# Patient Record
Sex: Female | Born: 1990 | Race: Black or African American | Hispanic: No | Marital: Single | Smoking: Never smoker
Health system: Southern US, Community
[De-identification: ages and names within clinical notes are randomized; demographics above are authoritative.]

## PROBLEM LIST (undated history)

## (undated) ENCOUNTER — Inpatient Hospital Stay (HOSPITAL_COMMUNITY): Payer: Self-pay

## (undated) DIAGNOSIS — D649 Anemia, unspecified: Secondary | ICD-10-CM

---
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
## 2012-01-11 ENCOUNTER — Other Ambulatory Visit: Payer: Medicaid Other

## 2012-01-25 ENCOUNTER — Encounter (HOSPITAL_COMMUNITY): Payer: Self-pay

## 2012-01-25 ENCOUNTER — Ambulatory Visit (INDEPENDENT_AMBULATORY_CARE_PROVIDER_SITE_OTHER): Payer: Self-pay | Admitting: General Surgery

## 2012-01-25 ENCOUNTER — Emergency Department (INDEPENDENT_AMBULATORY_CARE_PROVIDER_SITE_OTHER)
Admission: EM | Admit: 2012-01-25 | Discharge: 2012-01-25 | Disposition: A | Discharge status: Home / Self Care | Payer: Medicaid Other | Source: Home / Self Care | Attending: Emergency Medicine | Admitting: Emergency Medicine

## 2012-01-25 DIAGNOSIS — L255 Unspecified contact dermatitis due to plants, except food: Secondary | ICD-10-CM

## 2012-01-25 DIAGNOSIS — L237 Allergic contact dermatitis due to plants, except food: Secondary | ICD-10-CM

## 2012-01-25 MED ORDER — PREDNISONE 10 MG PO TABS: ORAL_TABLET | ORAL | Status: DC

## 2012-01-25 MED ORDER — TRIAMCINOLONE ACETONIDE 0.1 % EX CREA: TOPICAL_CREAM | Freq: Three times a day (TID) | CUTANEOUS | Status: AC

## 2012-01-25 NOTE — ED Provider Notes (Signed)
Chief Complaint  Patient presents with  . Rash    History of Present Illness:   The patient is an 21 year old female who has had a five-day history of a mildly pruritic rash on her left knee. This occurred while she was outdoors at a friend's house, although she denies any definite exposure to poison ivy. She's had some mild itching and a few bumps on both of her arms and her face as well. She denies any difficulty breathing or swelling of her throat, tongue, or lips.  Review of Systems:  Other than noted above, the patient denies any of the following symptoms: Systemic:  No fever, chills, sweats, weight loss, or fatigue. ENT:  No nasal congestion, rhinorrhea, sore throat, swelling of lips, tongue or throat. Resp:  No cough, wheezing, or shortness of breath. Skin:  No rash, itching, nodules, or suspicious lesions.  PMFSH:  Past medical history, family history, social history, meds, and allergies were reviewed.  Physical Exam:   Vital signs:  BP 117/67  Pulse 54  Temp 96.8 F (36 C) (Oral)  Resp 10  SpO2 99% Gen:  Alert, oriented, in no distress. ENT:  Pharynx clear, no intraoral lesions, moist mucous membranes. Lungs:  Clear to auscultation. Skin:  She has a 2.5 x 2.5 cm patch of erythematous maculopapules and a few vesicles on her left knee. Skin was otherwise clear.  Assessment:  The encounter diagnosis was Poison ivy.  Plan:   1.  The following meds were prescribed:   New Prescriptions   PREDNISONE (DELTASONE) 10 MG TABLET    Take 4 tabs daily for 4 days, 3 tabs daily for 4 days, 2 tabs daily for 4 days, then 1 tab daily for 4 days.   TRIAMCINOLONE CREAM (KENALOG) 0.1 %    Apply topically 3 (three) times daily.   2.  The patient was instructed in symptomatic care and handouts were given. 3.  The patient was told to return if becoming worse in any way, if no better in 3 or 4 days, and given some red flag symptoms that would indicate earlier return.     Reuben Likes,  MD 01/25/12 1323

## 2012-01-25 NOTE — ED Notes (Signed)
C/o area on her lower leg itching for past couple dys

## 2012-01-26 ENCOUNTER — Encounter (INDEPENDENT_AMBULATORY_CARE_PROVIDER_SITE_OTHER): Payer: Self-pay | Admitting: General Surgery

## 2012-07-15 ENCOUNTER — Other Ambulatory Visit (HOSPITAL_COMMUNITY): Payer: Self-pay | Admitting: Obstetrics

## 2012-07-15 DIAGNOSIS — R109 Unspecified abdominal pain: Secondary | ICD-10-CM

## 2012-07-15 DIAGNOSIS — R102 Pelvic and perineal pain: Secondary | ICD-10-CM

## 2012-07-20 ENCOUNTER — Other Ambulatory Visit (HOSPITAL_COMMUNITY): Payer: Self-pay | Admitting: Obstetrics

## 2012-07-20 DIAGNOSIS — R109 Unspecified abdominal pain: Secondary | ICD-10-CM

## 2012-07-21 ENCOUNTER — Ambulatory Visit (HOSPITAL_COMMUNITY): Payer: Medicaid Other

## 2012-07-21 ENCOUNTER — Ambulatory Visit (HOSPITAL_COMMUNITY): Admission: RE | Admit: 2012-07-21 | Payer: Medicaid Other | Source: Ambulatory Visit

## 2012-07-21 ENCOUNTER — Ambulatory Visit (HOSPITAL_COMMUNITY): Payer: Medicaid Other | Attending: Obstetrics

## 2014-01-26 ENCOUNTER — Emergency Department (HOSPITAL_COMMUNITY)
Admission: EM | Admit: 2014-01-26 | Discharge: 2014-01-26 | Disposition: A | Discharge status: Home / Self Care | Payer: No Typology Code available for payment source | Attending: Emergency Medicine | Admitting: Emergency Medicine

## 2014-01-26 ENCOUNTER — Emergency Department (HOSPITAL_COMMUNITY): Payer: No Typology Code available for payment source

## 2014-01-26 ENCOUNTER — Encounter (HOSPITAL_COMMUNITY): Payer: Self-pay | Admitting: Emergency Medicine

## 2014-01-26 DIAGNOSIS — S8990XA Unspecified injury of unspecified lower leg, initial encounter: Secondary | ICD-10-CM | POA: Diagnosis present

## 2014-01-26 DIAGNOSIS — J45909 Unspecified asthma, uncomplicated: Secondary | ICD-10-CM | POA: Insufficient documentation

## 2014-01-26 DIAGNOSIS — Y9389 Activity, other specified: Secondary | ICD-10-CM | POA: Diagnosis not present

## 2014-01-26 DIAGNOSIS — Y9289 Other specified places as the place of occurrence of the external cause: Secondary | ICD-10-CM | POA: Insufficient documentation

## 2014-01-26 DIAGNOSIS — IMO0002 Reserved for concepts with insufficient information to code with codable children: Secondary | ICD-10-CM | POA: Diagnosis not present

## 2014-01-26 DIAGNOSIS — M79605 Pain in left leg: Secondary | ICD-10-CM

## 2014-01-26 DIAGNOSIS — S99929A Unspecified injury of unspecified foot, initial encounter: Principal | ICD-10-CM

## 2014-01-26 DIAGNOSIS — S99919A Unspecified injury of unspecified ankle, initial encounter: Principal | ICD-10-CM

## 2014-01-26 MED ORDER — ACETAMINOPHEN 325 MG PO TABS
650.0000 mg | ORAL_TABLET | Freq: Once | ORAL | Status: AC
  Administered 2014-01-26: 650 mg via ORAL
  Filled 2014-01-26: qty 2

## 2014-01-26 MED ORDER — ACETAMINOPHEN 500 MG PO TABS: 500.0000 mg | ORAL_TABLET | Freq: Four times a day (QID) | ORAL | Status: DC | PRN
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
## 2014-01-26 NOTE — Discharge Instructions (Signed)
Take tylenol as needed for pain. Refer to attached documents for more information. Follow up with your doctor as needed.  °

## 2014-01-29 NOTE — ED Provider Notes (Signed)
History/physical exam/procedure(s) were performed by non-physician practitioner and as supervising physician I was immediately available for consultation/collaboration. I have reviewed all notes and am in agreement with care and plan.   Raynell Scott S Bryanne Riquelme, MD 01/29/14 1253 

## 2014-10-23 ENCOUNTER — Encounter (HOSPITAL_BASED_OUTPATIENT_CLINIC_OR_DEPARTMENT_OTHER): Payer: Self-pay | Admitting: *Deleted

## 2014-10-23 ENCOUNTER — Emergency Department (HOSPITAL_BASED_OUTPATIENT_CLINIC_OR_DEPARTMENT_OTHER): Payer: Medicaid Other

## 2014-10-23 DIAGNOSIS — Z720 Tobacco use: Secondary | ICD-10-CM | POA: Diagnosis not present

## 2014-10-23 DIAGNOSIS — Y9389 Activity, other specified: Secondary | ICD-10-CM | POA: Diagnosis not present

## 2014-10-23 DIAGNOSIS — Y998 Other external cause status: Secondary | ICD-10-CM | POA: Insufficient documentation

## 2014-10-23 DIAGNOSIS — J45909 Unspecified asthma, uncomplicated: Secondary | ICD-10-CM | POA: Diagnosis not present

## 2014-10-23 DIAGNOSIS — Z793 Long term (current) use of hormonal contraceptives: Secondary | ICD-10-CM | POA: Diagnosis not present

## 2014-10-23 DIAGNOSIS — S0990XA Unspecified injury of head, initial encounter: Secondary | ICD-10-CM | POA: Insufficient documentation

## 2014-10-23 DIAGNOSIS — Y92410 Unspecified street and highway as the place of occurrence of the external cause: Secondary | ICD-10-CM | POA: Diagnosis not present

## 2014-10-23 NOTE — ED Notes (Signed)
Pt denies any neck tenderness and denies any cervical tenderness on palpation.  CT per Dr. Fredderick PhenixBelfi
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
## 2014-10-24 ENCOUNTER — Emergency Department (HOSPITAL_BASED_OUTPATIENT_CLINIC_OR_DEPARTMENT_OTHER)
Admission: EM | Admit: 2014-10-24 | Discharge: 2014-10-24 | Disposition: A | Discharge status: Home / Self Care | Payer: Medicaid Other | Attending: Emergency Medicine | Admitting: Emergency Medicine

## 2014-10-24 ENCOUNTER — Encounter (HOSPITAL_BASED_OUTPATIENT_CLINIC_OR_DEPARTMENT_OTHER): Payer: Self-pay | Admitting: Emergency Medicine

## 2014-10-24 MED ORDER — ACETAMINOPHEN 500 MG PO TABS
1000.0000 mg | ORAL_TABLET | Freq: Once | ORAL | Status: AC
  Administered 2014-10-24: 1000 mg via ORAL
  Filled 2014-10-24: qty 2

## 2014-10-24 MED ORDER — ACETAMINOPHEN 500 MG PO TABS: 500.0000 mg | ORAL_TABLET | Freq: Four times a day (QID) | ORAL | Status: DC | PRN

## 2014-10-24 MED ORDER — METHOCARBAMOL 500 MG PO TABS: 500.0000 mg | ORAL_TABLET | Freq: Two times a day (BID) | ORAL | Status: DC

## 2014-10-24 MED ORDER — METHOCARBAMOL 500 MG PO TABS
1000.0000 mg | ORAL_TABLET | Freq: Once | ORAL | Status: AC
  Administered 2014-10-24: 1000 mg via ORAL
  Filled 2014-10-24: qty 2

## 2014-10-24 NOTE — Discharge Instructions (Signed)

## 2014-10-24 NOTE — ED Notes (Signed)
Patient asleep when entered room to move patient to new room. The patient took some promting to get awake. The patient states that she "would try" to ambulate to the next room> patient up and ambulatory to room without any grimacing or limping. Patient asked if we can get a urine specimin and she state " I don't gotta pee" - asked if she could try and she states "look I dont gotta pee". Patient back to bed and back to sleep.
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
## 2014-10-24 NOTE — ED Notes (Signed)
Mother at bedside and questioning why the patient did not have an x-ray on her back. The patient was informed that she could not have the x-ray without urine for the upreg and was asked for the urine again. The patient responded "im sick of this just lets go". Mother at bedside and aware of this.   Patient given work / school note for time off asked for.

## 2015-02-25 ENCOUNTER — Ambulatory Visit: Payer: No Typology Code available for payment source | Attending: Chiropractic Medicine | Admitting: Physical Therapy

## 2015-03-05 ENCOUNTER — Ambulatory Visit: Payer: No Typology Code available for payment source | Admitting: Physical Therapy

## 2015-12-12 ENCOUNTER — Inpatient Hospital Stay (HOSPITAL_COMMUNITY): Payer: Medicaid Other

## 2015-12-12 ENCOUNTER — Encounter (HOSPITAL_COMMUNITY): Payer: Self-pay

## 2015-12-12 ENCOUNTER — Inpatient Hospital Stay (HOSPITAL_COMMUNITY)
Admission: AD | Admit: 2015-12-12 | Discharge: 2015-12-12 | Disposition: A | Discharge status: Home / Self Care | Payer: Medicaid Other | Source: Ambulatory Visit | Attending: Obstetrics & Gynecology | Admitting: Obstetrics & Gynecology

## 2015-12-12 DIAGNOSIS — O4690 Antepartum hemorrhage, unspecified, unspecified trimester: Secondary | ICD-10-CM | POA: Diagnosis not present

## 2015-12-12 DIAGNOSIS — O4692 Antepartum hemorrhage, unspecified, second trimester: Secondary | ICD-10-CM

## 2015-12-12 DIAGNOSIS — O99019 Anemia complicating pregnancy, unspecified trimester: Secondary | ICD-10-CM | POA: Insufficient documentation

## 2015-12-12 DIAGNOSIS — Z6790 Unspecified blood type, Rh positive: Secondary | ICD-10-CM

## 2015-12-12 DIAGNOSIS — O3680X0 Pregnancy with inconclusive fetal viability, not applicable or unspecified: Secondary | ICD-10-CM

## 2015-12-12 DIAGNOSIS — O26899 Other specified pregnancy related conditions, unspecified trimester: Secondary | ICD-10-CM | POA: Diagnosis present

## 2015-12-12 LAB — URINALYSIS, ROUTINE W REFLEX MICROSCOPIC
Bilirubin Urine: NEGATIVE
Glucose, UA: NEGATIVE mg/dL
Ketones, ur: NEGATIVE mg/dL
NITRITE: NEGATIVE
PH: 7 (ref 5.0–8.0)
Protein, ur: NEGATIVE mg/dL

## 2015-12-12 LAB — URINE MICROSCOPIC-ADD ON

## 2015-12-12 LAB — CBC
HCT: 29.9 % — ABNORMAL LOW (ref 36.0–46.0)
HEMOGLOBIN: 10.1 g/dL — AB (ref 12.0–15.0)
MCH: 29.2 pg (ref 26.0–34.0)
MCHC: 33.8 g/dL (ref 30.0–36.0)
MCV: 86.4 fL (ref 78.0–100.0)
Platelets: 219 10*3/uL (ref 150–400)
RBC: 3.46 MIL/uL — ABNORMAL LOW (ref 3.87–5.11)
RDW: 13.7 % (ref 11.5–15.5)
WBC: 9.1 10*3/uL (ref 4.0–10.5)

## 2015-12-12 LAB — ABO/RH: ABO/RH(D): B POS

## 2015-12-12 LAB — POCT PREGNANCY, URINE: PREG TEST UR: POSITIVE — AB

## 2015-12-12 LAB — HCG, QUANTITATIVE, PREGNANCY: HCG, BETA CHAIN, QUANT, S: 874 m[IU]/mL — AB (ref <5)

## 2015-12-12 LAB — TYPE AND SCREEN
ABO/RH(D): B POS
Antibody Screen: NEGATIVE

## 2015-12-12 NOTE — Discharge Instructions (Signed)

## 2015-12-12 NOTE — MAU Provider Note (Signed)
History     CSN: [id number]  Arrival date and time: 12/12/15 1541   First Provider Initiated Contact with Patient 12/12/15 1644      Chief Complaint  Patient presents with  . Vaginal Bleeding   HPI Comments: G1P0 @21 .1 wks by sure LMP c/o VB and uterine cramping x2 days. Reports bleeding is bright red, heavier than a period, and passed blood clots quarter sized and smaller. [facility] but [facility] she's uncertain of which hospital. She has a remote hx of CMT and asthma.   OB History    Gravida Para Term Preterm AB TAB SAB Ectopic Multiple Living   1               Past Medical History  Diagnosis Date  . Asthma     History reviewed. No pertinent past surgical history.  Family History  Problem Relation Age of Onset  . Asthma Mother   . Asthma Maternal Grandmother     Social History  Substance Use Topics  . Smoking status: Current Every Day Smoker -- 0.50 packs/day  . Smokeless tobacco: Never Used  . Alcohol Use: No    Allergies:  Allergies  Allergen Reactions  . Aspirin Anaphylaxis, Hives and Swelling  . Amoxicillin     Pt states it causes pink eye  . Eggs Or Egg-Derived Products Nausea And Vomiting  . Benadryl [Diphenhydramine Hcl] Rash    Prescriptions prior to admission  Medication Sig Dispense Refill Last Dose  . acetaminophen (TYLENOL) 500 MG tablet Take 1 tablet (500 mg total) by mouth every 6 (six) hours as needed. (Patient taking differently: Take 500 mg by mouth every 6 (six) hours as needed for moderate pain. ) 30 tablet 0 Past Month at Unknown time  . ibuprofen (ADVIL,MOTRIN) 200 MG tablet Take 200 mg by mouth every 6 (six) hours as needed (pain).   Past Month at Unknown time  . methocarbamol (ROBAXIN) 500 MG tablet Take 1 tablet (500 mg total) by mouth 2 (two) times daily. 20 tablet 0 Jan 2017 at Unknown time  . Prenatal Vit-Fe Fumarate-FA  (PRENATAL MULTIVITAMIN) TABS tablet Take 1 tablet by mouth daily at 12 noon.   12/12/2015 at Unknown time    Review of Systems  Constitutional: Negative.   HENT: Negative.   Eyes: Negative.   Respiratory: Negative.   Cardiovascular: Negative.   Genitourinary: Negative.        +VB +uterine cramping  Musculoskeletal: Negative.   Skin: Negative.   Neurological: Negative.   Endo/Heme/Allergies: Negative.   Psychiatric/Behavioral: Negative.    Physical Exam   Blood pressure 120/73, pulse 97, temperature 98.3 F (36.8 C), temperature source Oral, resp. rate 18, height 5\' 2"  (1.575 m), weight 139 lb 1.9 oz (63.104 kg), last menstrual period 07/21/2015.  Physical Exam  Constitutional: She is oriented to person, place, and time. She appears well-developed and well-nourished.  HENT:  Head: Normocephalic and atraumatic.  Neck: Normal range of motion. Neck supple.  Cardiovascular: Normal rate.   Respiratory: Effort normal.  GI: Soft. She exhibits no distension. There is no tenderness. There is no rebound.  Genitourinary:  External: nml genitalia Speculum: small amt bright red blood, no active bleeding from os, os visually closed Bimanual: closed, thick, no uterine tenderness, no mass, uterine size 6-8 wks Cultures obtained  Musculoskeletal: Normal range of motion.  Neurological: She is alert and oriented  to person, place, and time.  Skin: Skin is warm and dry.  Psychiatric: She has a normal mood and affect.   Results for orders placed or performed during the hospital encounter of 12/12/15 (from the past 24 hour(s))  Urinalysis, Routine w reflex microscopic (not at [facility])     Status: Abnormal   Collection Time: 12/12/15  4:06 PM  Result Value Ref Range   Color, Urine YELLOW YELLOW   APPearance HAZY (A) CLEAR   Specific Gravity, Urine <1.005 (L) 1.005 - 1.030   pH 7.0 5.0 - 8.0   Glucose, UA NEGATIVE NEGATIVE mg/dL   Hgb urine dipstick LARGE (A) NEGATIVE   Bilirubin Urine NEGATIVE  NEGATIVE   Ketones, ur NEGATIVE NEGATIVE mg/dL   Protein, ur NEGATIVE NEGATIVE mg/dL   Nitrite NEGATIVE NEGATIVE   Leukocytes, UA TRACE (A) NEGATIVE  Urine microscopic-add on     Status: Abnormal   Collection Time: 12/12/15  4:06 PM  Result Value Ref Range   Squamous Epithelial / LPF 0-5 (A) NONE SEEN   WBC, UA 0-5 0 - 5 WBC/hpf   RBC / HPF 6-30 0 - 5 RBC/hpf   Bacteria, UA RARE (A) NONE SEEN  Pregnancy, urine POC     Status: Abnormal   Collection Time: 12/12/15  4:20 PM  Result Value Ref Range   Preg Test, Ur POSITIVE (A) NEGATIVE  Type and screen [facility]     Status: None   Collection Time: 12/12/15  5:16 PM  Result Value Ref Range   ABO/RH(D) B POS    Antibody Screen NEG    Sample Expiration 12/15/2015   hCG, quantitative, pregnancy     Status: Abnormal   Collection Time: 12/12/15  5:18 PM  Result Value Ref Range   hCG, Beta Chain, Quant, S 874 (H) <5 mIU/mL  CBC     Status: Abnormal   Collection Time: 12/12/15  5:18 PM  Result Value Ref Range   WBC 9.1 4.0 - 10.5 K/uL   RBC 3.46 (L) 3.87 - 5.11 MIL/uL   Hemoglobin 10.1 (L) 12.0 - 15.0 g/dL   HCT 16.1 (L) 09.6 - 04.5 %   MCV 86.4 78.0 - 100.0 fL   MCH 29.2 26.0 - 34.0 pg   MCHC 33.8 30.0 - 36.0 g/dL   RDW 40.9 81.1 - 91.4 %   Platelets 219 150 - 400 K/uL   US Ob Comp Less 14 Wks  12/12/2015  : Please refer to the report of accession #[id number] for details this examination. Electronically Signed   By: Alcide Clever M.D.   On: 12/12/2015 19:01   US Ob Transvaginal  12/12/2015  CLINICAL DATA:  Playing and cramping for 2 days EXAM: OBSTETRIC <14 WK [place] AND TRANSVAGINAL OB US TECHNIQUE: Both transabdominal and transvaginal ultrasound examinations were performed for complete evaluation of the gestation as well as the maternal uterus, adnexal regions, and pelvic cul-de-sac. Transvaginal technique was performed to assess early pregnancy. COMPARISON:  None. FINDINGS: Intrauterine gestational sac: None present  Maternal uterus/adnexae: The uterus is bulky and heterogeneous measuring 15.9 x 6.8 x 8.8 cm. The endometrium is heterogeneous measuring 12 mm. The ovaries are within normal limits bilaterally. IMPRESSION: No intra or extrauterine gestational sac is identified. The uterus is enlarged and bulky without definitive fibroid change. The patient gives a history of recent ultrasound showing a 22 week fetus in late May. Findings on today's exam 2 not correspond with that history. Correlation with beta HCG level and physical  exam is recommended. Electronically Signed   By: Alcide Clever M.D.   On: 12/12/2015 18:56   MAU Course  Procedures  MDM Ultrasound, ABO/Rh, CBC, Quant HCG. Labs and sono reviewed. No evidence of IUP or ectopic pregnancy. Plan to rpt quant in 2 days. [facility] ED called, no record of visit on file. Stable for discharge home. Precautions reviewed w/pt.  Assessment and Plan  Pregnancy of unknown anatomic location Rh positive Mild anemia  Discharge home  Follow-up in MAU in 2 days for rpt quant-orders entered Bleeding and ectopic precautions  Donette Larry 12/12/2015, 5:04 PM

## 2015-12-12 NOTE — MAU Note (Signed)
Pt reports she is pregnant and has been bleeding and having cramps x 2 days.

## 2015-12-13 LAB — GC/CHLAMYDIA PROBE AMP (~~LOC~~) NOT AT ARMC
Chlamydia: NEGATIVE
NEISSERIA GONORRHEA: NEGATIVE

## 2015-12-14 ENCOUNTER — Encounter (HOSPITAL_COMMUNITY): Payer: Self-pay

## 2015-12-14 ENCOUNTER — Inpatient Hospital Stay (HOSPITAL_COMMUNITY)
Admission: AD | Admit: 2015-12-14 | Discharge: 2015-12-14 | Disposition: A | Discharge status: Home / Self Care | Payer: Medicaid Other | Source: Ambulatory Visit | Attending: Obstetrics | Admitting: Obstetrics

## 2015-12-14 DIAGNOSIS — N939 Abnormal uterine and vaginal bleeding, unspecified: Secondary | ICD-10-CM | POA: Diagnosis present

## 2015-12-14 DIAGNOSIS — O039 Complete or unspecified spontaneous abortion without complication: Secondary | ICD-10-CM | POA: Diagnosis not present

## 2015-12-14 LAB — HCG, QUANTITATIVE, PREGNANCY: HCG, BETA CHAIN, QUANT, S: 242 m[IU]/mL — AB (ref <5)

## 2015-12-14 NOTE — MAU Note (Signed)
Patient presents for follow BHCG, still cramping, vaginal bleeding has lessened, breast soreness started last night.

## 2015-12-14 NOTE — MAU Provider Note (Signed)
S: Seen 2 days ago for VB in pregnancy, no IUP on sono, for rpt quant today. Bleeding has tapered. No pain present.   O:  GENERAL: Well-developed, well-nourished female in no acute distress.  LUNGS: Effort normal SKIN: Warm, dry and without erythema PSYCH: Normal mood and affect  Quant HCG 242 today (down from 874-2 days ago)  A/P: SAB Rh positive Follow-up in 1 week at [facility] to call with appt Bleeding precautions

## 2015-12-31 ENCOUNTER — Other Ambulatory Visit: Payer: Medicaid Other

## 2016-12-02 ENCOUNTER — Encounter (HOSPITAL_BASED_OUTPATIENT_CLINIC_OR_DEPARTMENT_OTHER): Payer: Self-pay | Admitting: Radiology

## 2016-12-02 ENCOUNTER — Emergency Department (HOSPITAL_BASED_OUTPATIENT_CLINIC_OR_DEPARTMENT_OTHER): Payer: Medicaid Other

## 2016-12-02 ENCOUNTER — Emergency Department (HOSPITAL_BASED_OUTPATIENT_CLINIC_OR_DEPARTMENT_OTHER)
Admission: EM | Admit: 2016-12-02 | Discharge: 2016-12-02 | Disposition: A | Discharge status: Home / Self Care | Payer: Medicaid Other | Attending: Emergency Medicine | Admitting: Emergency Medicine

## 2016-12-02 DIAGNOSIS — F17200 Nicotine dependence, unspecified, uncomplicated: Secondary | ICD-10-CM | POA: Diagnosis not present

## 2016-12-02 DIAGNOSIS — J069 Acute upper respiratory infection, unspecified: Secondary | ICD-10-CM | POA: Insufficient documentation

## 2016-12-02 DIAGNOSIS — J45901 Unspecified asthma with (acute) exacerbation: Secondary | ICD-10-CM | POA: Diagnosis not present

## 2016-12-02 DIAGNOSIS — B9789 Other viral agents as the cause of diseases classified elsewhere: Secondary | ICD-10-CM

## 2016-12-02 DIAGNOSIS — R0602 Shortness of breath: Secondary | ICD-10-CM | POA: Diagnosis present

## 2016-12-02 DIAGNOSIS — J988 Other specified respiratory disorders: Secondary | ICD-10-CM

## 2016-12-02 MED ORDER — IPRATROPIUM-ALBUTEROL 0.5-2.5 (3) MG/3ML IN SOLN
3.0000 mL | Freq: Once | RESPIRATORY_TRACT | Status: AC
  Administered 2016-12-02: 3 mL via RESPIRATORY_TRACT

## 2016-12-02 MED ORDER — ALBUTEROL SULFATE HFA 108 (90 BASE) MCG/ACT IN AERS
2.0000 | INHALATION_SPRAY | Freq: Once | RESPIRATORY_TRACT | Status: AC
  Administered 2016-12-02: 2 via RESPIRATORY_TRACT
  Filled 2016-12-02: qty 6.7

## 2016-12-02 MED ORDER — HYDROCOD POLST-CPM POLST ER 10-8 MG/5ML PO SUER
5.0000 mL | Freq: Once | ORAL | Status: AC
  Administered 2016-12-02: 5 mL via ORAL
  Filled 2016-12-02: qty 5

## 2016-12-02 MED ORDER — ALBUTEROL SULFATE (2.5 MG/3ML) 0.083% IN NEBU
2.5000 mg | INHALATION_SOLUTION | Freq: Once | RESPIRATORY_TRACT | Status: AC
  Administered 2016-12-02: 2.5 mg via RESPIRATORY_TRACT

## 2016-12-02 MED ORDER — IPRATROPIUM-ALBUTEROL 0.5-2.5 (3) MG/3ML IN SOLN
RESPIRATORY_TRACT | Status: AC
  Administered 2016-12-02: 3 mL via RESPIRATORY_TRACT
  Filled 2016-12-02: qty 3

## 2016-12-02 MED ORDER — ALBUTEROL SULFATE (2.5 MG/3ML) 0.083% IN NEBU
5.0000 mg | INHALATION_SOLUTION | Freq: Once | RESPIRATORY_TRACT | Status: AC
  Administered 2016-12-02: 5 mg via RESPIRATORY_TRACT
  Filled 2016-12-02: qty 6

## 2016-12-02 MED ORDER — IPRATROPIUM BROMIDE 0.02 % IN SOLN
0.5000 mg | Freq: Once | RESPIRATORY_TRACT | Status: AC
  Administered 2016-12-02: 0.5 mg via RESPIRATORY_TRACT
  Filled 2016-12-02: qty 2.5

## 2016-12-02 MED ORDER — ALBUTEROL SULFATE (2.5 MG/3ML) 0.083% IN NEBU
INHALATION_SOLUTION | RESPIRATORY_TRACT | Status: AC
  Administered 2016-12-02: 2.5 mg via RESPIRATORY_TRACT
  Filled 2016-12-02: qty 3

## 2016-12-02 MED ORDER — PREDNISONE 10 MG (21) PO TBPK: ORAL_TABLET | Freq: Every day | ORAL | 0 refills | Status: DC

## 2016-12-02 MED ORDER — HYDROCODONE-HOMATROPINE 5-1.5 MG/5ML PO SYRP
5.0000 mL | ORAL_SOLUTION | ORAL | Status: DC
  Filled 2016-12-02: qty 5

## 2016-12-02 MED ORDER — PREDNISONE 50 MG PO TABS
60.0000 mg | ORAL_TABLET | Freq: Once | ORAL | Status: AC
  Administered 2016-12-02: 60 mg via ORAL
  Filled 2016-12-02: qty 1

## 2016-12-02 NOTE — Discharge Instructions (Signed)
Read the information below.  Use the prescribed medication as directed.  Please discuss all new medications with your pharmacist.  You may return to the Emergency Department at any time for worsening condition or any new symptoms that concern you.   If you develop worsening shortness of breath, uncontrolled wheezing, severe chest pain, or fevers despite using tylenol and/or ibuprofen, return for a recheck.     °

## 2016-12-02 NOTE — ED Triage Notes (Signed)
Shortness of breath for two days.  Productive cough, green sputum.  RR 32.

## 2016-12-02 NOTE — ED Provider Notes (Signed)
[facility] Provider Note   CSN: [id number] Arrival date & time: 12/02/16  1022     History   Chief Complaint Chief Complaint  Patient presents with  . Shortness of Breath    HPI Jamie Gonzales is a 26 y.o. female.  HPI   Pt with hx smoking, asthma, out of her inhaler x 4 months, p/w cough productive of green sputum, SOB, wheezing, chills/sweats, lightheadedness x 2 days.  Denies leg swelling, recent immobilization, exogenous estrogen.    Past Medical History:  Diagnosis Date  . Asthma     There are no active problems to display for this patient.   History reviewed. No pertinent surgical history.  OB History    Gravida Para Term Preterm AB Living   1             SAB TAB Ectopic Multiple Live Births                   Home Medications    Prior to Admission medications   Medication Sig Start Date End Date Taking? Authorizing Provider  albuterol (PROVENTIL HFA;VENTOLIN HFA) 108 (90 Base) MCG/ACT inhaler Inhale 2 puffs into the lungs every 6 (six) hours as needed for wheezing or shortness of breath.   Yes [provider]  predniSONE (STERAPRED UNI-PAK 21 TAB) 10 MG (21) TBPK tablet Take by mouth daily. Day 1: take 6 tabs.  Day 2: 5 tabs  Day 3: 4 tabs  Day 4: 3 tabs  Day 5: 2 tabs  Day 6: 1 tab Start tomorrow, May 31 12/02/16   Trixie Dredge, PA-C  Prenatal Vit-Fe Fumarate-FA (PRENATAL MULTIVITAMIN) TABS tablet Take 1 tablet by mouth daily at 12 noon.    [provider]    Family History Family History  Problem Relation Age of Onset  . Asthma Mother   . Asthma Maternal Grandmother     Social History Social History  Substance Use Topics  . Smoking status: Current Every Day Smoker    Packs/day: 0.50  . Smokeless tobacco: Never Used  . Alcohol use No     Allergies   Aspirin; Amoxicillin; Eggs or egg-derived products; and Benadryl [diphenhydramine hcl]   Review of Systems Review of Systems  Constitutional: Positive for  chills. Negative for fever.  Respiratory: Positive for cough, shortness of breath and wheezing.   Cardiovascular: Positive for chest pain. Negative for leg swelling.  Allergic/Immunologic: Negative for immunocompromised state.  All other systems reviewed and are negative.    Physical Exam Updated Vital Signs BP 102/76 (BP Location: Right Arm)   Pulse 86   Temp 98 F (36.7 C) (Oral)   Resp 18   Ht 5\' 3"  (1.6 m)   Wt 65.8 kg (145 lb)   LMP 11/14/2016   SpO2 100%   BMI 25.69 kg/m   Physical Exam  Constitutional: She appears well-developed and well-nourished. No distress.  HENT:  Head: Normocephalic and atraumatic.  Neck: Neck supple.  Cardiovascular: Normal rate and regular rhythm.   Pulmonary/Chest: Effort normal. No respiratory distress. She has wheezes. She has no rales.  Musculoskeletal: She exhibits no edema.  Neurological: She is alert.  Skin: She is not diaphoretic.  Nursing note and vitals reviewed.    ED Treatments / Results  Labs (all labs ordered are listed, but only abnormal results are displayed) Labs Reviewed - No data to display  EKG  EKG Interpretation None       Radiology Dg Chest 2 View  Result Date: 12/02/2016 CLINICAL DATA:  Shortness of breath for 2 days EXAM: CHEST  2 VIEW COMPARISON:  10/31/2014 FINDINGS: Cardiac shadows within normal limits. The lungs are clear bilaterally. Very mild scoliosis is noted in the mid thoracic spine concave to the right. No other focal abnormality is seen. IMPRESSION: No acute abnormality noted. Electronically Signed   By: Alcide CleverMark  Lukens M.D.   On: 12/02/2016 11:01    Procedures Procedures (including critical care time)  Medications Ordered in ED Medications  albuterol (PROVENTIL) (2.5 MG/3ML) 0.083% nebulizer solution 2.5 mg (2.5 mg Nebulization Given 12/02/16 1031)  ipratropium-albuterol (DUONEB) 0.5-2.5 (3) MG/3ML nebulizer solution 3 mL (3 mLs Nebulization Given 12/02/16 1031)  predniSONE (DELTASONE) tablet  60 mg (60 mg Oral Given 12/02/16 1101)  albuterol (PROVENTIL HFA;VENTOLIN HFA) 108 (90 Base) MCG/ACT inhaler 2 puff (2 puffs Inhalation Given 12/02/16 1137)  albuterol (PROVENTIL) (2.5 MG/3ML) 0.083% nebulizer solution 5 mg (5 mg Nebulization Given 12/02/16 1125)  ipratropium (ATROVENT) nebulizer solution 0.5 mg (0.5 mg Nebulization Given 12/02/16 1124)  chlorpheniramine-HYDROcodone (TUSSIONEX) 10-8 MG/5ML suspension 5 mL (5 mLs Oral Given 12/02/16 1206)     Initial Impression / Assessment and Plan / ED Course  I have reviewed the triage vital signs and the nursing notes.  Pertinent labs & imaging results that were available during my care of the patient were reviewed by me and considered in my medical decision making (see chart for details).  Clinical Course as of Dec 03 1347  Wed Dec 02, 2016  1216 Improved but continued wheezing.  Offered further nebs but pt declines, ready for d/c home.    [EW]    Clinical Course User Index [EW] ChadWest, Marvelous Wool[place], PA-C    Afebrile, nontoxic patient with hx asthma, smoking, with cough and wheezing, increased work of breathing x 2 days.  No inhaler at home currently.  CXR negative.  Nebs, steroids given with improvement.    D/C home with albuterol, steroids. PCP follow up.  Discussed result, findings, treatment, and follow up  with patient.  Pt given return precautions.  Pt verbalizes understanding and agrees with plan.       Final Clinical Impressions(s) / ED Diagnoses   Final diagnoses:  Exacerbation of asthma, unspecified asthma severity, unspecified whether persistent  Viral respiratory infection    New Prescriptions Discharge Medication List as of 12/02/2016 12:18 PM    START taking these medications   Details  predniSONE (STERAPRED UNI-PAK 21 TAB) 10 MG (21) TBPK tablet Take by mouth daily. Day 1: take 6 tabs.  Day 2: 5 tabs  Day 3: 4 tabs  Day 4: 3 tabs  Day 5: 2 tabs  Day 6: 1 tab Start tomorrow, May 31, Starting Wed 12/02/2016, Print           [place], Django Nguyen, [place]PA-C 12/02/16 1349    Geoffery Lyonselo, [place], MD 12/02/16 1414

## 2017-01-05 ENCOUNTER — Emergency Department (HOSPITAL_COMMUNITY)
Admission: EM | Admit: 2017-01-05 | Discharge: 2017-01-06 | Payer: Medicaid Other | Attending: Emergency Medicine | Admitting: Emergency Medicine

## 2017-01-05 DIAGNOSIS — H10213 Acute toxic conjunctivitis, bilateral: Secondary | ICD-10-CM | POA: Insufficient documentation

## 2017-01-05 DIAGNOSIS — Y999 Unspecified external cause status: Secondary | ICD-10-CM | POA: Insufficient documentation

## 2017-01-05 DIAGNOSIS — Y939 Activity, unspecified: Secondary | ICD-10-CM | POA: Diagnosis not present

## 2017-01-05 DIAGNOSIS — Y929 Unspecified place or not applicable: Secondary | ICD-10-CM | POA: Diagnosis not present

## 2017-01-05 DIAGNOSIS — W503XXA Accidental bite by another person, initial encounter: Secondary | ICD-10-CM

## 2017-01-05 DIAGNOSIS — Z23 Encounter for immunization: Secondary | ICD-10-CM | POA: Insufficient documentation

## 2017-01-05 DIAGNOSIS — S00212A Abrasion of left eyelid and periocular area, initial encounter: Secondary | ICD-10-CM | POA: Insufficient documentation

## 2017-01-05 DIAGNOSIS — F17200 Nicotine dependence, unspecified, uncomplicated: Secondary | ICD-10-CM | POA: Insufficient documentation

## 2017-01-05 DIAGNOSIS — Z79899 Other long term (current) drug therapy: Secondary | ICD-10-CM | POA: Insufficient documentation

## 2017-01-05 DIAGNOSIS — T65894A Toxic effect of other specified substances, undetermined, initial encounter: Secondary | ICD-10-CM | POA: Diagnosis not present

## 2017-01-05 DIAGNOSIS — S61451A Open bite of right hand, initial encounter: Secondary | ICD-10-CM

## 2017-01-05 DIAGNOSIS — H578 Other specified disorders of eye and adnexa: Secondary | ICD-10-CM | POA: Diagnosis present

## 2017-01-05 DIAGNOSIS — S0081XA Abrasion of other part of head, initial encounter: Secondary | ICD-10-CM

## 2017-01-05 DIAGNOSIS — J45909 Unspecified asthma, uncomplicated: Secondary | ICD-10-CM | POA: Insufficient documentation

## 2017-01-05 DIAGNOSIS — S60571A Other superficial bite of hand of right hand, initial encounter: Secondary | ICD-10-CM | POA: Insufficient documentation
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
## 2017-01-06 ENCOUNTER — Encounter (HOSPITAL_COMMUNITY): Payer: Self-pay

## 2017-01-06 MED ORDER — TETANUS-DIPHTH-ACELL PERTUSSIS 5-2.5-18.5 LF-MCG/0.5 IM SUSP
0.5000 mL | Freq: Once | INTRAMUSCULAR | Status: AC
  Administered 2017-01-06: 0.5 mL via INTRAMUSCULAR
  Filled 2017-01-06: qty 0.5

## 2017-01-06 MED ORDER — PROPARACAINE HCL 0.5 % OP SOLN
OPHTHALMIC | Status: AC
  Administered 2017-01-06: 01:00:00
  Filled 2017-01-06: qty 15

## 2017-01-06 MED ORDER — CLINDAMYCIN HCL 150 MG PO CAPS: 300.0000 mg | ORAL_CAPSULE | Freq: Three times a day (TID) | ORAL | 0 refills | Status: DC

## 2017-01-06 MED ORDER — ACETAMINOPHEN 500 MG PO TABS: 500.0000 mg | ORAL_TABLET | Freq: Four times a day (QID) | ORAL | 0 refills | Status: DC | PRN

## 2017-01-06 MED ORDER — CLINDAMYCIN HCL 300 MG PO CAPS
300.0000 mg | ORAL_CAPSULE | Freq: Four times a day (QID) | ORAL | Status: DC
  Administered 2017-01-06 (×2): 300 mg via ORAL
  Filled 2017-01-06: qty 1

## 2017-01-06 NOTE — ED Notes (Signed)
Patient tolerated 500 cc NS fluid irrigation per Lequita HaltMorgan lens to right eye

## 2017-01-06 NOTE — ED Notes (Addendum)
Only one dose of clindamycin given
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
## 2017-01-06 NOTE — ED Notes (Signed)
Wound care and dressing placed right hand
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
## 2017-04-22 ENCOUNTER — Encounter (HOSPITAL_BASED_OUTPATIENT_CLINIC_OR_DEPARTMENT_OTHER): Payer: Self-pay | Admitting: *Deleted

## 2017-04-22 ENCOUNTER — Inpatient Hospital Stay (HOSPITAL_COMMUNITY): Payer: Medicaid Other

## 2017-04-22 ENCOUNTER — Inpatient Hospital Stay (HOSPITAL_BASED_OUTPATIENT_CLINIC_OR_DEPARTMENT_OTHER)
Admission: AD | Admit: 2017-04-22 | Discharge: 2017-04-22 | Disposition: A | Discharge status: Other Acute Inpatient Hospital | Payer: Medicaid Other | Source: Ambulatory Visit | Attending: Obstetrics and Gynecology | Admitting: Obstetrics and Gynecology

## 2017-04-22 DIAGNOSIS — F17210 Nicotine dependence, cigarettes, uncomplicated: Secondary | ICD-10-CM | POA: Diagnosis not present

## 2017-04-22 DIAGNOSIS — O468X1 Other antepartum hemorrhage, first trimester: Secondary | ICD-10-CM | POA: Diagnosis not present

## 2017-04-22 DIAGNOSIS — O418X10 Other specified disorders of amniotic fluid and membranes, first trimester, not applicable or unspecified: Secondary | ICD-10-CM

## 2017-04-22 DIAGNOSIS — O26891 Other specified pregnancy related conditions, first trimester: Secondary | ICD-10-CM | POA: Diagnosis not present

## 2017-04-22 DIAGNOSIS — N939 Abnormal uterine and vaginal bleeding, unspecified: Secondary | ICD-10-CM | POA: Diagnosis present

## 2017-04-22 DIAGNOSIS — Z79899 Other long term (current) drug therapy: Secondary | ICD-10-CM | POA: Diagnosis not present

## 2017-04-22 DIAGNOSIS — Z3491 Encounter for supervision of normal pregnancy, unspecified, first trimester: Secondary | ICD-10-CM

## 2017-04-22 DIAGNOSIS — J45909 Unspecified asthma, uncomplicated: Secondary | ICD-10-CM | POA: Diagnosis not present

## 2017-04-22 DIAGNOSIS — Z3A00 Weeks of gestation of pregnancy not specified: Secondary | ICD-10-CM | POA: Diagnosis not present

## 2017-04-22 DIAGNOSIS — O209 Hemorrhage in early pregnancy, unspecified: Secondary | ICD-10-CM

## 2017-04-22 DIAGNOSIS — Z3A11 11 weeks gestation of pregnancy: Secondary | ICD-10-CM

## 2017-04-22 LAB — CBC WITH DIFFERENTIAL/PLATELET
BASOS PCT: 0 %
Basophils Absolute: 0 10*3/uL (ref 0.0–0.1)
EOS PCT: 1 %
Eosinophils Absolute: 0.1 10*3/uL (ref 0.0–0.7)
HEMATOCRIT: 33.1 % — AB (ref 36.0–46.0)
Hemoglobin: 11.2 g/dL — ABNORMAL LOW (ref 12.0–15.0)
Lymphocytes Relative: 40 %
Lymphs Abs: 4.6 10*3/uL — ABNORMAL HIGH (ref 0.7–4.0)
MCH: 29.4 pg (ref 26.0–34.0)
MCHC: 33.8 g/dL (ref 30.0–36.0)
MCV: 86.9 fL (ref 78.0–100.0)
MONO ABS: 0.6 10*3/uL (ref 0.1–1.0)
MONOS PCT: 6 %
NEUTROS ABS: 6.1 10*3/uL (ref 1.7–7.7)
Neutrophils Relative %: 53 %
Platelets: 267 10*3/uL (ref 150–400)
RBC: 3.81 MIL/uL — ABNORMAL LOW (ref 3.87–5.11)
RDW: 13.7 % (ref 11.5–15.5)
WBC: 11.5 10*3/uL — ABNORMAL HIGH (ref 4.0–10.5)

## 2017-04-22 LAB — BASIC METABOLIC PANEL
Anion gap: 7 (ref 5–15)
BUN: 11 mg/dL (ref 6–20)
CALCIUM: 9.6 mg/dL (ref 8.9–10.3)
CO2: 23 mmol/L (ref 22–32)
CREATININE: 0.6 mg/dL (ref 0.44–1.00)
Chloride: 104 mmol/L (ref 101–111)
GLUCOSE: 72 mg/dL (ref 65–99)
Potassium: 4.1 mmol/L (ref 3.5–5.1)
Sodium: 134 mmol/L — ABNORMAL LOW (ref 135–145)

## 2017-04-22 LAB — WET PREP, GENITAL
SPERM: NONE SEEN
TRICH WET PREP: NONE SEEN
YEAST WET PREP: NONE SEEN

## 2017-04-22 LAB — URINALYSIS, ROUTINE W REFLEX MICROSCOPIC
BILIRUBIN URINE: NEGATIVE
Glucose, UA: NEGATIVE mg/dL
Ketones, ur: NEGATIVE mg/dL
NITRITE: NEGATIVE
PH: 6.5 (ref 5.0–8.0)
Protein, ur: NEGATIVE mg/dL
SPECIFIC GRAVITY, URINE: 1.015 (ref 1.005–1.030)

## 2017-04-22 LAB — URINALYSIS, MICROSCOPIC (REFLEX)

## 2017-04-22 LAB — PREGNANCY, URINE: Preg Test, Ur: POSITIVE — AB

## 2017-04-22 LAB — HCG, QUANTITATIVE, PREGNANCY: hCG, Beta Chain, Quant, S: 74089 m[IU]/mL — ABNORMAL HIGH (ref <5)

## 2017-04-22 MED ORDER — NITROFURANTOIN MONOHYD MACRO 100 MG PO CAPS
100.0000 mg | ORAL_CAPSULE | Freq: Once | ORAL | Status: AC
  Administered 2017-04-22: 100 mg via ORAL
  Filled 2017-04-22: qty 1

## 2017-04-22 MED ORDER — METRONIDAZOLE 500 MG PO TABS: 500.0000 mg | ORAL_TABLET | Freq: Two times a day (BID) | ORAL | 0 refills | Status: DC

## 2017-04-22 MED ORDER — ACETAMINOPHEN 500 MG PO TABS
1000.0000 mg | ORAL_TABLET | Freq: Once | ORAL | Status: AC
Start: 2017-04-22 — End: 2017-04-22
  Administered 2017-04-22: 1000 mg via ORAL
  Filled 2017-04-22: qty 2

## 2017-04-22 MED ORDER — NITROFURANTOIN MONOHYD MACRO 100 MG PO CAPS: 100.0000 mg | ORAL_CAPSULE | Freq: Two times a day (BID) | ORAL | 0 refills | Status: DC

## 2017-04-22 NOTE — ED Provider Notes (Signed)
[facility] EMERGENCY DEPARTMENT Provider Note   CSN: [id number] Arrival date & time: 04/22/17  0011     History   Chief Complaint Chief Complaint  Patient presents with  . Vaginal Bleeding    HPI Jamie Gonzales is a 26 y.o. female.  The history is provided by the patient.  Vaginal Bleeding  Primary symptoms include vaginal bleeding.  Primary symptoms include no dysuria. There has been no fever. This is a new problem. The current episode started more than 1 week ago (nine days ago). The problem occurs constantly. The problem has not changed since onset.The symptoms occur spontaneously. She has not missed her period. LMP: started 9 days ago but usually lasts only 5 days.  States all previous menstrual cycles have been on time and not short or light  The patient's menstrual history has been regular. Pertinent negatives include no anorexia, no diarrhea and no vomiting. Associated symptoms comments: Menstrual cramps, worse than usual. Treatments tried: ibuprofen taken by patient at home last taken at 5 pm  The treatment provided no relief. There is no concern regarding sexually transmitted diseases. Associated medical issues do not include ovarian cysts.    Past Medical History:  Diagnosis Date  . Asthma     There are no active problems to display for this patient.   History reviewed. No pertinent surgical history.  OB History    Gravida Para Term Preterm AB Living   1             SAB TAB Ectopic Multiple Live Births                   Home Medications    Prior to Admission medications   Medication Sig Start Date End Date Taking? Authorizing Provider  acetaminophen (TYLENOL) 500 MG tablet Take 1 tablet (500 mg total) by mouth every 6 (six) hours as needed for mild pain or moderate pain. 01/06/17   Antony MaduraHumes, Kelly, PA-C  albuterol (PROVENTIL HFA;VENTOLIN HFA) 108 (90 Base) MCG/ACT inhaler Inhale 2 puffs into the lungs every 6 (six) hours as needed for wheezing or  shortness of breath.    [provider]    Family History Family History  Problem Relation Age of Onset  . Asthma Mother   . Asthma Maternal Grandmother     Social History Social History  Substance Use Topics  . Smoking status: Current Every Day Smoker    Packs/day: 0.50  . Smokeless tobacco: Never Used  . Alcohol use No     Allergies   Aspirin; Amoxicillin; Eggs or egg-derived products; and Benadryl [diphenhydramine hcl]   Review of Systems Review of Systems  Constitutional: Negative for fever.  Respiratory: Negative for shortness of breath.   Cardiovascular: Negative for chest pain.  Gastrointestinal: Negative for anorexia, diarrhea and vomiting.  Genitourinary: Positive for vaginal bleeding. Negative for dysuria.  All other systems reviewed and are negative.    Physical Exam Updated Vital Signs BP 123/74   Pulse 73   Temp 98.6 F (37 C)   Resp 16   Ht 5\' 3"  (1.6 m)   Wt 65.8 kg (145 lb)   LMP 04/12/2017   SpO2 100%   BMI 25.69 kg/m   Physical Exam  Constitutional: She is oriented to person, place, and time. She appears well-developed and well-nourished. No distress.  HENT:  Head: Normocephalic and atraumatic.  Mouth/Throat: No oropharyngeal exudate.  Eyes: Pupils are equal, round, and reactive to light. Conjunctivae are normal.  Neck: Normal range of motion. Neck supple.  Cardiovascular: Normal rate, regular rhythm, normal heart sounds and intact distal pulses.   Pulmonary/Chest: Effort normal and breath sounds normal. She has no wheezes. She has no rales.  Abdominal: Soft. Bowel sounds are normal. She exhibits no mass. There is no tenderness. There is no guarding.  Genitourinary:  Genitourinary Comments: Scant brownish blood OS closed no cmt or adnexal tenderness.  Chaperone Windell Moulding present   Musculoskeletal: Normal range of motion.  Neurological: She is alert and oriented to person, place, and time. She displays normal reflexes.  Skin: Skin is  warm and dry. Capillary refill takes less than 2 seconds.  Psychiatric: She has a normal mood and affect.     ED Treatments / Results   Vitals:   04/22/17 0016  BP: 123/74  Pulse: 73  Resp: 16  Temp: 98.6 F (37 C)  SpO2: 100%    Labs (all labs ordered are listed, but only abnormal results are displayed) Results for orders placed or performed during the hospital encounter of 04/22/17  Wet prep, genital  Result Value Ref Range   Yeast Wet Prep HPF POC NONE SEEN NONE SEEN   Trich, Wet Prep NONE SEEN NONE SEEN   Clue Cells Wet Prep HPF POC PRESENT (A) NONE SEEN   WBC, Wet Prep HPF POC MODERATE (A) NONE SEEN   Sperm NONE SEEN   Urinalysis, Routine w reflex microscopic  Result Value Ref Range   Color, Urine YELLOW YELLOW   APPearance CLOUDY (A) CLEAR   Specific Gravity, Urine 1.015 1.005 - 1.030   pH 6.5 5.0 - 8.0   Glucose, UA NEGATIVE NEGATIVE mg/dL   Hgb urine dipstick LARGE (A) NEGATIVE   Bilirubin Urine NEGATIVE NEGATIVE   Ketones, ur NEGATIVE NEGATIVE mg/dL   Protein, ur NEGATIVE NEGATIVE mg/dL   Nitrite NEGATIVE NEGATIVE   Leukocytes, UA TRACE (A) NEGATIVE  Pregnancy, urine  Result Value Ref Range   Preg Test, Ur POSITIVE (A) NEGATIVE  CBC with Differential/Platelet  Result Value Ref Range   WBC 11.5 (H) 4.0 - 10.5 K/uL   RBC 3.81 (L) 3.87 - 5.11 MIL/uL   Hemoglobin 11.2 (L) 12.0 - 15.0 g/dL   HCT 16.1 (L) 09.6 - 04.5 %   MCV 86.9 78.0 - 100.0 fL   MCH 29.4 26.0 - 34.0 pg   MCHC 33.8 30.0 - 36.0 g/dL   RDW 40.9 81.1 - 91.4 %   Platelets 267 150 - 400 K/uL   Neutrophils Relative % 53 %   Neutro Abs 6.1 1.7 - 7.7 K/uL   Lymphocytes Relative 40 %   Lymphs Abs 4.6 (H) 0.7 - 4.0 K/uL   Monocytes Relative 6 %   Monocytes Absolute 0.6 0.1 - 1.0 K/uL   Eosinophils Relative 1 %   Eosinophils Absolute 0.1 0.0 - 0.7 K/uL   Basophils Relative 0 %   Basophils Absolute 0.0 0.0 - 0.1 K/uL  Basic metabolic panel  Result Value Ref Range   Sodium 134 (L) 135 - 145  mmol/L   Potassium 4.1 3.5 - 5.1 mmol/L   Chloride 104 101 - 111 mmol/L   CO2 23 22 - 32 mmol/L   Glucose, Bld 72 65 - 99 mg/dL   BUN 11 6 - 20 mg/dL   Creatinine, Ser 7.82 0.44 - 1.00 mg/dL   Calcium 9.6 8.9 - 95.6 mg/dL   GFR calc non Af Amer >60 >60 mL/min   GFR calc Af Amer >60 >60 mL/min  Anion gap 7 5 - 15  hCG, quantitative, pregnancy  Result Value Ref Range   hCG, Beta Chain, Quant, S 74,089 (H) <5 mIU/mL  Urinalysis, Microscopic (reflex)  Result Value Ref Range   RBC / HPF 0-5 0 - 5 RBC/hpf   WBC, UA 6-30 0 - 5 WBC/hpf   Bacteria, UA MANY (A) NONE SEEN   Squamous Epithelial / LPF 6-30 (A) NONE SEEN   Mucus PRESENT    No results found.    205 case d/w Dr. Emelda Fear who will accept the patient in transfer  Friend present at the bedside who will transport the patient POV to the MAU at [facility]    Procedures Procedures (including critical care time)  Medications Ordered in ED Medications  nitrofurantoin (macrocrystal-monohydrate) (MACROBID) capsule 100 mg (100 mg Oral Given 04/22/17 0122)  acetaminophen (TYLENOL) tablet 1,000 mg (1,000 mg Oral Given 04/22/17 0122)      Final Clinical Impressions(s) / ED Diagnoses   Final diagnoses:  Pregnancy of unknown anatomic location  Will treat for asymptomatic bacteruria and BV, (RX stapled to your [facility] discharge paperwork) take all medication as directed.  No more Ibuprofen or NSAIDs of any kind.  Go straight to the MAU at [facility] for further evaluation with ultrasound.  Patient verbalizes understanding and agrees to follow up immediately.  These instructions were also printed on your discharge paperwork and communicated with you verbally by your nurse.    Strict return precautions given for  Shortness of breath, swelling or the lips or tongue, chest pain, dyspnea on exertion, new weakness or numbness changes in vision or speech,  Inability to tolerate liquids or food, changes in voice cough, altered mental  status or any concerns. No signs of systemic illness or infection. The patient is nontoxic-appearing on exam and vital signs are within normal limits.    I have reviewed the triage vital signs and the nursing notes. Pertinent labs &imaging results that were available during my care of the patient were reviewed by me and considered in my medical decision making (see chart for details).  After history, exam, and medical workup I feel the patient has been appropriately medically screened and is safe for discharge home. Pertinent diagnoses were discussed with the patient. Patient was given return precautions.   New Prescriptions New Prescriptions   No medications on file     Neveen Daponte, MD 04/22/17 0222

## 2017-04-22 NOTE — MAU Note (Signed)
[facility]. Pt reports some lower abdominal cramping and spotting that started last Monday.

## 2017-04-22 NOTE — ED Triage Notes (Signed)
Pt c/o vaginal bleeding x 2 weeks.

## 2017-04-22 NOTE — MAU Provider Note (Signed)
History     CSN: [id number]  Arrival date and time: 04/22/17 4098   First Provider Initiated Contact with Patient 04/22/17 0400      Chief Complaint  Patient presents with  . Vaginal Bleeding   HPI Jamie Gonzales is a 26 y.o. female who presents for vaginal bleeding. Was seen at [facility] this evening for vaginal bleeding in early pregnancy. Ultrasound not available there at night so patient was sent here for ultrasound to confirm IUP. Patient reports vaginal bleeding off & on for the last 3 months. Most recent episode started earlier this week. States bleeding is consistent with menses. Is not saturating pads or passing clots. Endorses irregular lower abdominal cramping that feels like menstrual cramps. Has tx successfully with ibuprofen.  Denies dysuria or other urinary complaints.    OB History    Gravida Para Term Preterm AB Living   2       1     SAB TAB Ectopic Multiple Live Births   1              Past Medical History:  Diagnosis Date  . Asthma     History reviewed. No pertinent surgical history.  Family History  Problem Relation Age of Onset  . Asthma Mother   . Asthma Maternal Grandmother     Social History  Substance Use Topics  . Smoking status: Current Every Day Smoker    Packs/day: 0.50  . Smokeless tobacco: Never Used  . Alcohol use No    Allergies:  Allergies  Allergen Reactions  . Aspirin Anaphylaxis, Hives and Swelling  . Amoxicillin     Pt states it causes pink eye  . Eggs Or Egg-Derived Products Nausea And Vomiting  . Benadryl [Diphenhydramine Hcl] Rash    Prescriptions Prior to Admission  Medication Sig Dispense Refill Last Dose  . acetaminophen (TYLENOL) 500 MG tablet Take 1 tablet (500 mg total) by mouth every 6 (six) hours as needed for mild pain or moderate pain. 30 tablet 0   . albuterol (PROVENTIL HFA;VENTOLIN HFA) 108 (90 Base) MCG/ACT inhaler Inhale 2 puffs into the lungs every 6 (six) hours as needed for wheezing  or shortness of breath.       Review of Systems  Constitutional: Negative.   Gastrointestinal: Negative.   Genitourinary: Positive for vaginal bleeding. Negative for dysuria, flank pain and frequency.   Physical Exam   Blood pressure 106/69, pulse 82, temperature 98.2 F (36.8 C), temperature source Oral, resp. rate 16, height 5\' 3"  (1.6 m), weight 135 lb (61.2 kg), last menstrual period 04/12/2017, SpO2 100 %, unknown if currently breastfeeding.  Physical Exam  Nursing note and vitals reviewed. Constitutional: She is oriented to person, place, and time. She appears well-developed and well-nourished. No distress.  HENT:  Head: Normocephalic and atraumatic.  Eyes: Conjunctivae are normal. Right eye exhibits no discharge. Left eye exhibits no discharge. No scleral icterus.  Neck: Normal range of motion.  Respiratory: Effort normal. No respiratory distress.  GI: Soft. There is no CVA tenderness.  Neurological: She is alert and oriented to person, place, and time.  Skin: Skin is warm and dry. She is not diaphoretic.  Psychiatric: She has a normal mood and affect. Her behavior is normal. Judgment and thought content normal.    MAU Course  Procedures Results for orders placed or performed during the hospital encounter of 04/22/17 (from the past 24 hour(s))  Urinalysis, Routine w reflex microscopic  Status: Abnormal   Collection Time: 04/22/17 12:12 AM  Result Value Ref Range   Color, Urine YELLOW YELLOW   APPearance CLOUDY (A) CLEAR   Specific Gravity, Urine 1.015 1.005 - 1.030   pH 6.5 5.0 - 8.0   Glucose, UA NEGATIVE NEGATIVE mg/dL   Hgb urine dipstick LARGE (A) NEGATIVE   Bilirubin Urine NEGATIVE NEGATIVE   Ketones, ur NEGATIVE NEGATIVE mg/dL   Protein, ur NEGATIVE NEGATIVE mg/dL   Nitrite NEGATIVE NEGATIVE   Leukocytes, UA TRACE (A) NEGATIVE  Pregnancy, urine     Status: Abnormal   Collection Time: 04/22/17 12:12 AM  Result Value Ref Range   Preg Test, Ur POSITIVE (A)  NEGATIVE  Urinalysis, Microscopic (reflex)     Status: Abnormal   Collection Time: 04/22/17 12:12 AM  Result Value Ref Range   RBC / HPF 0-5 0 - 5 RBC/hpf   WBC, UA 6-30 0 - 5 WBC/hpf   Bacteria, UA MANY (A) NONE SEEN   Squamous Epithelial / LPF 6-30 (A) NONE SEEN   Mucus PRESENT   Wet prep, genital     Status: Abnormal   Collection Time: 04/22/17 12:20 AM  Result Value Ref Range   Yeast Wet Prep HPF POC NONE SEEN NONE SEEN   Trich, Wet Prep NONE SEEN NONE SEEN   Clue Cells Wet Prep HPF POC PRESENT (A) NONE SEEN   WBC, Wet Prep HPF POC MODERATE (A) NONE SEEN   Sperm NONE SEEN   CBC with Differential/Platelet     Status: Abnormal   Collection Time: 04/22/17 12:34 AM  Result Value Ref Range   WBC 11.5 (H) 4.0 - 10.5 K/uL   RBC 3.81 (L) 3.87 - 5.11 MIL/uL   Hemoglobin 11.2 (L) 12.0 - 15.0 g/dL   HCT 81.133.1 (L) 91.436.0 - 78.246.0 %   MCV 86.9 78.0 - 100.0 fL   MCH 29.4 26.0 - 34.0 pg   MCHC 33.8 30.0 - 36.0 g/dL   RDW 95.613.7 21.311.5 - 08.615.5 %   Platelets 267 150 - 400 K/uL   Neutrophils Relative % 53 %   Neutro Abs 6.1 1.7 - 7.7 K/uL   Lymphocytes Relative 40 %   Lymphs Abs 4.6 (H) 0.7 - 4.0 K/uL   Monocytes Relative 6 %   Monocytes Absolute 0.6 0.1 - 1.0 K/uL   Eosinophils Relative 1 %   Eosinophils Absolute 0.1 0.0 - 0.7 K/uL   Basophils Relative 0 %   Basophils Absolute 0.0 0.0 - 0.1 K/uL  Basic metabolic panel     Status: Abnormal   Collection Time: 04/22/17 12:34 AM  Result Value Ref Range   Sodium 134 (L) 135 - 145 mmol/L   Potassium 4.1 3.5 - 5.1 mmol/L   Chloride 104 101 - 111 mmol/L   CO2 23 22 - 32 mmol/L   Glucose, Bld 72 65 - 99 mg/dL   BUN 11 6 - 20 mg/dL   Creatinine, Ser 5.780.60 0.44 - 1.00 mg/dL   Calcium 9.6 8.9 - 46.910.3 mg/dL   GFR calc non Af Amer >60 >60 mL/min   GFR calc Af Amer >60 >60 mL/min   Anion gap 7 5 - 15  hCG, quantitative, pregnancy     Status: Abnormal   Collection Time: 04/22/17 12:34 AM  Result Value Ref Range   hCG, Beta Chain, Quant, S 74,089 (H)  <5 mIU/mL   [place] Ob Comp Less 14 Wks  Result Date: 04/22/2017 CLINICAL DATA:  Cramping and spotting in the first  trimester pregnancy. EXAM: OBSTETRIC <14 WK ULTRASOUND TECHNIQUE: Transabdominal ultrasound was performed for evaluation of the gestation as well as the maternal uterus and adnexal regions. COMPARISON:  None. FINDINGS: Intrauterine gestational sac: Single Yolk sac:  Visualized. Embryo:  Visualized. Cardiac Activity: Visualized. Heart Rate: 182 bpm CRL:   43.2  mm   11 w 1 d                  [place] EDC: 11/10/2017 Subchorionic hemorrhage: Large left-sided caudal focus of hemorrhage measuring 7 x 1.7 x 1.5 cm. Maternal uterus/adnexae: Neither ovary was visualized. IMPRESSION: 1. Large, crescentic subchorionic focus of hemorrhage along the left lateral and caudal aspect of the gestation measuring 7 x 1.7 x 1.5 cm. 2. 11 week 1 day single live intrauterine gestation with ultrasound EDC of 11/10/2017. Electronically Signed   By: Tollie Eth M.D.   On: 04/22/2017 03:56    MDM Ultrasound shows SIUP measuring [redacted]w[redacted]d with cardiac activity & lg SCH.  Exam performed at [facility] -- cervix closed B positive Patient discharged with macrobid for UTI tx. Patient denies urinary complaints. Urine looks contaminated. Will recollect urine & send for urine culture.   Assessment and Plan  A: 1. Subchorionic hematoma in first trimester, single or unspecified fetus   2. Vaginal bleeding in pregnancy, first trimester   3. [redacted] weeks gestation of pregnancy   4. Normal IUP (intrauterine pregnancy) on prenatal ultrasound, first trimester    P: Discharge home Pelvic rest Bleeding precautions Start prenatal care asap Urine culture pending  Judeth Horn 04/22/2017, 4:00 AM

## 2017-04-22 NOTE — ED Notes (Signed)
[facility]

## 2017-04-22 NOTE — Discharge Instructions (Signed)
Subchorionic Hematoma °A subchorionic hematoma is a gathering of blood between the outer wall of the placenta and the inner wall of the womb (uterus). The placenta is the organ that connects the fetus to the wall of the uterus. The placenta performs the feeding, breathing (oxygen to the fetus), and waste removal (excretory work) of the fetus. °Subchorionic hematoma is the most common abnormality found on a result from ultrasonography done during the first trimester or early second trimester of pregnancy. If there has been little or no vaginal bleeding, early small hematomas usually shrink on their own and do not affect your baby or pregnancy. The blood is gradually absorbed over 1-2 weeks. When bleeding starts later in pregnancy or the hematoma is larger or occurs in an older pregnant woman, the outcome may not be as good. Larger hematomas may get bigger, which increases the chances for miscarriage. Subchorionic hematoma also increases the risk of premature detachment of the placenta from the uterus, preterm (premature) labor, and stillbirth. °Follow these instructions at home: °· Stay on bed rest if your health care provider recommends this. Although bed rest will not prevent more bleeding or prevent a miscarriage, your health care provider may recommend bed rest until you are advised otherwise. °· Avoid heavy lifting (more than 10 lb [4.5 kg]), exercise, sexual intercourse, or douching as directed by your health care provider. °· Keep track of the number of pads you use each day and how soaked (saturated) they are. Write down this information. °· Do not use tampons. °· Keep all follow-up appointments as directed by your health care provider. Your health care provider may ask you to have follow-up blood tests or ultrasound tests or both. °Get help right away if: °· You have severe cramps in your stomach, back, abdomen, or pelvis. °· You have a fever. °· You pass large clots or tissue. Save any tissue for your  health care provider to look at. °· Your bleeding increases or you become lightheaded, feel weak, or have fainting episodes. °This information is not intended to replace advice given to you by your health care provider. Make sure you discuss any questions you have with your health care provider. °Document Released: 10/07/2006 Document Revised: 11/28/2015 Document Reviewed: 01/19/2013 °Elsevier Interactive Patient Education © 2017 [employer] ° °

## 2017-04-23 LAB — GC/CHLAMYDIA PROBE AMP (~~LOC~~) NOT AT ARMC
Chlamydia: NEGATIVE
NEISSERIA GONORRHEA: NEGATIVE

## 2017-04-25 LAB — CULTURE, OB URINE: Culture: 1000 — AB

## 2017-10-09 ENCOUNTER — Emergency Department (HOSPITAL_BASED_OUTPATIENT_CLINIC_OR_DEPARTMENT_OTHER)
Admission: EM | Admit: 2017-10-09 | Discharge: 2017-10-09 | Disposition: A | Discharge status: Home / Self Care | Payer: Medicaid Other | Attending: Emergency Medicine | Admitting: Emergency Medicine

## 2017-10-09 ENCOUNTER — Encounter (HOSPITAL_BASED_OUTPATIENT_CLINIC_OR_DEPARTMENT_OTHER): Payer: Self-pay

## 2017-10-09 ENCOUNTER — Other Ambulatory Visit: Payer: Self-pay

## 2017-10-09 DIAGNOSIS — F17210 Nicotine dependence, cigarettes, uncomplicated: Secondary | ICD-10-CM | POA: Insufficient documentation

## 2017-10-09 DIAGNOSIS — N898 Other specified noninflammatory disorders of vagina: Secondary | ICD-10-CM

## 2017-10-09 DIAGNOSIS — J45909 Unspecified asthma, uncomplicated: Secondary | ICD-10-CM | POA: Insufficient documentation

## 2017-10-09 DIAGNOSIS — N760 Acute vaginitis: Secondary | ICD-10-CM | POA: Diagnosis not present

## 2017-10-09 DIAGNOSIS — B9689 Other specified bacterial agents as the cause of diseases classified elsewhere: Secondary | ICD-10-CM | POA: Insufficient documentation

## 2017-10-09 DIAGNOSIS — Z711 Person with feared health complaint in whom no diagnosis is made: Secondary | ICD-10-CM

## 2017-10-09 LAB — URINALYSIS, COMPLETE (UACMP) WITH MICROSCOPIC
BILIRUBIN URINE: NEGATIVE
GLUCOSE, UA: NEGATIVE mg/dL
KETONES UR: NEGATIVE mg/dL
NITRITE: NEGATIVE
PH: 6 (ref 5.0–8.0)
PROTEIN: NEGATIVE mg/dL
Specific Gravity, Urine: >1.03 — ABNORMAL HIGH (ref 1.005–1.030)

## 2017-10-09 LAB — WET PREP, GENITAL
Sperm: NONE SEEN
Trich, Wet Prep: NONE SEEN
Yeast Wet Prep HPF POC: NONE SEEN

## 2017-10-09 LAB — PREGNANCY, URINE: PREG TEST UR: NEGATIVE

## 2017-10-09 MED ORDER — AZITHROMYCIN 1 G PO PACK
1.0000 g | PACK | Freq: Once | ORAL | Status: AC
  Administered 2017-10-09: 1 g via ORAL
  Filled 2017-10-09: qty 1

## 2017-10-09 MED ORDER — METRONIDAZOLE 500 MG PO TABS: 500.0000 mg | ORAL_TABLET | Freq: Two times a day (BID) | ORAL | 0 refills | Status: DC

## 2017-10-09 MED ORDER — METRONIDAZOLE 500 MG PO TABS
500.0000 mg | ORAL_TABLET | Freq: Once | ORAL | Status: AC
  Administered 2017-10-09: 500 mg via ORAL
  Filled 2017-10-09: qty 1

## 2017-10-09 MED ORDER — CEFTRIAXONE SODIUM 250 MG IJ SOLR
250.0000 mg | Freq: Once | INTRAMUSCULAR | Status: AC
  Administered 2017-10-09: 250 mg via INTRAMUSCULAR
  Filled 2017-10-09: qty 250

## 2017-10-09 NOTE — ED Triage Notes (Signed)
Pt reports one week history of white vaginal discharge with odor. Associated pelvic pain. LMP 09/08/17 - Depo Shot given in Feb. Denies dysuria, back pain, fever, or vomiting.

## 2017-10-09 NOTE — Discharge Instructions (Signed)
Your workup today revealed evidence of bacterial vaginosis.  Given your history of infection and the mild tenderness you had, we will treat you for possible infection with antibiotics.  Please follow-up with your OB/GYN and PCP for further management.  If any symptoms change or worsen, please return the nearest emergency department.

## 2017-10-09 NOTE — ED Provider Notes (Signed)
[facility] EMERGENCY DEPARTMENT Provider Note   CSN: [id number] Arrival date & time: 10/09/17  1308     History   Chief Complaint Chief Complaint  Patient presents with  . Vaginal Discharge    HPI Jamie Gonzales is a 27 y.o. female.  The history is provided by the patient and medical records. No language interpreter was used.  Vaginal Discharge   This is a recurrent problem. The current episode started more than 2 days ago. The problem occurs constantly. The problem has not changed since onset.The discharge was white. Pertinent negatives include no anorexia, no diaphoresis, no fever, no abdominal swelling, no abdominal pain, no constipation, no diarrhea, no nausea, no vomiting, no dysuria, no frequency and no genital burning. She has tried nothing for the symptoms. The treatment provided no relief.    Past Medical History:  Diagnosis Date  . Asthma     There are no active problems to display for this patient.   History reviewed. No pertinent surgical history.   OB History    Gravida  2   Para      Term      Preterm      AB  1   Living        SAB  1   TAB      Ectopic      Multiple      Live Births               Home Medications    Prior to Admission medications   Medication Sig Start Date End Date Taking? Authorizing Provider  acetaminophen (TYLENOL) 500 MG tablet Take 1 tablet (500 mg total) by mouth every 6 (six) hours as needed for mild pain or moderate pain. 01/06/17   Antony Madura, PA-C  albuterol (PROVENTIL HFA;VENTOLIN HFA) 108 (90 Base) MCG/ACT inhaler Inhale 2 puffs into the lungs every 6 (six) hours as needed for wheezing or shortness of breath.    [provider]  metroNIDAZOLE (FLAGYL) 500 MG tablet Take 1 tablet (500 mg total) by mouth 2 (two) times daily. One po bid x 7 days 04/22/17   Nicanor Alcon, April, MD    Family History Family History  Problem Relation Age of Onset  . Asthma Mother   . Asthma Maternal  Grandmother     Social History Social History   Tobacco Use  . Smoking status: Current Every Day Smoker    Packs/day: 0.50  . Smokeless tobacco: Never Used  Substance Use Topics  . Alcohol use: Yes    Comment: social  . Drug use: No    Comment: smells of marijuana     Allergies   Aspirin; Amoxicillin; Eggs or egg-derived products; and Benadryl [diphenhydramine hcl]   Review of Systems Review of Systems  Constitutional: Negative for chills, diaphoresis, fatigue and fever.  HENT: Negative for congestion.   Eyes: Negative for visual disturbance.  Respiratory: Negative for cough, chest tightness, shortness of breath and wheezing.   Cardiovascular: Negative for chest pain and palpitations.  Gastrointestinal: Negative for abdominal pain, anorexia, constipation, diarrhea, nausea and vomiting.  Genitourinary: Positive for vaginal discharge and vaginal pain. Negative for difficulty urinating, dysuria, flank pain, frequency and vaginal bleeding.  Neurological: Negative for light-headedness.  All other systems reviewed and are negative.    Physical Exam Updated Vital Signs Ht 5\' 3"  (1.6 m)   Wt 65.8 kg (145 lb)   LMP 09/09/2017   Breastfeeding? Unknown   BMI 25.69 kg/m  Physical Exam  Constitutional: She appears well-developed and well-nourished. No distress.  HENT:  Head: Normocephalic and atraumatic.  Eyes: Conjunctivae are normal.  Neck: Neck supple.  Cardiovascular: Normal rate and regular rhythm.  No murmur heard. Pulmonary/Chest: Effort normal and breath sounds normal. No respiratory distress. She has no wheezes. She exhibits no tenderness.  Abdominal: Soft. There is no tenderness.  Genitourinary: There is no rash or tenderness on the right labia. There is no rash or tenderness on the left labia. Cervix exhibits motion tenderness (very mild) and discharge. Vaginal discharge found.  Musculoskeletal: She exhibits no edema or tenderness.  Neurological: She is alert.  No sensory deficit. She exhibits normal muscle tone.  Skin: Skin is warm and dry. Capillary refill takes less than 2 seconds. No rash noted. She is not diaphoretic. No erythema.  Psychiatric: She has a normal mood and affect.  Nursing note and vitals reviewed.    ED Treatments / Results  Labs (all labs ordered are listed, but only abnormal results are displayed) Labs Reviewed  WET PREP, GENITAL  PREGNANCY, URINE  URINALYSIS, COMPLETE (UACMP) WITH MICROSCOPIC  GC/CHLAMYDIA PROBE AMP ([place]) NOT AT [facility]    EKG None  Radiology No results found.  Procedures Procedures (including critical care time)  Medications Ordered in ED Medications - No data to display   Initial Impression / Assessment and Plan / ED Course  I have reviewed the triage vital signs and the nursing notes.  Pertinent labs & imaging results that were available during my care of the patient were reviewed by me and considered in my medical decision making (see chart for details).     Jamie Gonzales is a 27 y.o. female with a past medical history significant for STI who presents with mild pelvic pain and vaginal discharge.  Patient reports that for the last 4 days she has had a foul-smelling white vaginal discharge.  She reports it feels like when she had bacterial infection in the past.  She reports mild lower pelvic pain.  She denies recent injuries.  She denies any fevers, chills, nausea, vomiting, urinary or GI symptoms.  She denies other complaints on arrival.  She reports the pain is mild.  She denies any recent trauma.    On exam, patient had no abdominal tenderness.  Lungs clear.  Chest nontender.  GU exam was performed and patient was found to have some mild bleeding and discharge seen.  Patient had some mild cervical tenderness but no adnexal tenderness.  No chandelier sign.  Suspect cervicitis given the tenderness and discharge seen.  Patient reports she is having intermittent bleeding with  switching to her Depakote shot.  Suspect this is the cause of the bleeding.    Do not feel patient has TOA or other abscess however I suspect patient will need antibiotics to empirically treat for cervicitis.  Preg test negative.   Will await wet prep to determine if patient is other antibiotics the patient will likely need to follow-up with OB/GYN for further management.  Wet prep revealed clue cells.  No evidence of trichomoniasis.  No evidence of yeast.  Patient will be treated for BV as well as empirically for cervicitis.  Patient given Rocephin and azithromycin without complication.  Patient will follow up with my chart and OB/GYN for further results of GC/chlamydia test.  Patient  understood return precautions and was discharged in good condition.    Final Clinical Impressions(s) / ED Diagnoses   Final diagnoses:  BV (bacterial  vaginosis)  Vaginal discharge  Concern about STD in female without diagnosis    ED Discharge Orders        Ordered    metroNIDAZOLE (FLAGYL) 500 MG tablet  2 times daily     10/09/17 1121      Clinical Impression: 1. BV (bacterial vaginosis)   2. Vaginal discharge   3. Concern about STD in female without diagnosis     Disposition: Discharge  Condition: Good  I have discussed the results, Dx and Tx plan with the pt(& family if present). He/she/they expressed understanding and agree(s) with the plan. Discharge instructions discussed at great length. Strict return precautions discussed and pt &/or family have verbalized understanding of the instructions. No further questions at time of discharge.    New Prescriptions   METRONIDAZOLE (FLAGYL) 500 MG TABLET    Take 1 tablet (500 mg total) by mouth 2 (two) times daily.    Follow Up: Delane Ginger, MD [address] [facility] [address] [place] [address] [phone]     [facility] [address] [place] [address] [phone] Schedule an appointment as soon as possible  for a visit       Odean Mcelwain, Canary Brim, MD 10/09/17 1151

## 2017-10-11 LAB — GC/CHLAMYDIA PROBE AMP (~~LOC~~) NOT AT ARMC
CHLAMYDIA, DNA PROBE: NEGATIVE
Neisseria Gonorrhea: NEGATIVE

## 2018-01-08 ENCOUNTER — Encounter (HOSPITAL_BASED_OUTPATIENT_CLINIC_OR_DEPARTMENT_OTHER): Payer: Self-pay | Admitting: Emergency Medicine

## 2018-01-08 ENCOUNTER — Other Ambulatory Visit: Payer: Self-pay

## 2018-01-08 ENCOUNTER — Emergency Department (HOSPITAL_BASED_OUTPATIENT_CLINIC_OR_DEPARTMENT_OTHER)
Admission: EM | Admit: 2018-01-08 | Discharge: 2018-01-08 | Disposition: A | Discharge status: Home / Self Care | Payer: Medicaid Other | Attending: Emergency Medicine | Admitting: Emergency Medicine

## 2018-01-08 DIAGNOSIS — R519 Headache, unspecified: Secondary | ICD-10-CM

## 2018-01-08 DIAGNOSIS — F17210 Nicotine dependence, cigarettes, uncomplicated: Secondary | ICD-10-CM | POA: Diagnosis not present

## 2018-01-08 DIAGNOSIS — J45909 Unspecified asthma, uncomplicated: Secondary | ICD-10-CM | POA: Insufficient documentation

## 2018-01-08 DIAGNOSIS — R51 Headache: Secondary | ICD-10-CM | POA: Insufficient documentation

## 2018-01-08 DIAGNOSIS — F1210 Cannabis abuse, uncomplicated: Secondary | ICD-10-CM | POA: Insufficient documentation

## 2018-01-08 MED ORDER — PROCHLORPERAZINE EDISYLATE 10 MG/2ML IJ SOLN
10.0000 mg | Freq: Once | INTRAMUSCULAR | Status: AC
  Administered 2018-01-08: 10 mg via INTRAMUSCULAR
  Filled 2018-01-08: qty 2

## 2018-01-08 NOTE — ED Triage Notes (Signed)
Patient co headache onset 2 days ago; denies nvd

## 2018-01-08 NOTE — Discharge Instructions (Addendum)
Follow up with your PCP.  Return for fever, neck pain

## 2018-01-08 NOTE — ED Provider Notes (Signed)
[facility] EMERGENCY DEPARTMENT Provider Note   CSN: [id number] Arrival date & time: 01/08/18  9562     History   Chief Complaint Chief Complaint  Patient presents with  . Headache    HPI Jamie Gonzales is a 27 y.o. female.  27 yo F with a chief complaint of a headache.  This is described as right frontal dull and now is throughout her head.  She denies trauma denies fever.  She thinks is related to emotional distress of a passing of 1 of her friends.  Since then she has been having difficulty sleeping and eating and thinks that the cause of her symptoms.  She denies neck pain.  The history is provided by the patient.  Headache   This is a new problem. The current episode started 2 days ago. The problem occurs constantly. The problem has not changed since onset.The headache is associated with nothing. The pain is located in the right unilateral region. The quality of the pain is described as dull. The pain is at a severity of 7/10. The pain is moderate. The pain radiates to the face. Pertinent negatives include no fever, no palpitations, no shortness of breath, no nausea and no vomiting. She has tried nothing for the symptoms. The treatment provided no relief.    Past Medical History:  Diagnosis Date  . Asthma     There are no active problems to display for this patient.   History reviewed. No pertinent surgical history.   OB History    Gravida  2   Para      Term      Preterm      AB  1   Living        SAB  1   TAB      Ectopic      Multiple      Live Births               Home Medications    Prior to Admission medications   Medication Sig Start Date End Date Taking? Authorizing Provider  acetaminophen (TYLENOL) 500 MG tablet Take 1 tablet (500 mg total) by mouth every 6 (six) hours as needed for mild pain or moderate pain. 01/06/17   Antony Madura, PA-C  albuterol (PROVENTIL HFA;VENTOLIN HFA) 108 (90 Base) MCG/ACT inhaler Inhale 2 puffs  into the lungs every 6 (six) hours as needed for wheezing or shortness of breath.    [provider]  metroNIDAZOLE (FLAGYL) 500 MG tablet Take 1 tablet (500 mg total) by mouth 2 (two) times daily. One po bid x 7 days 04/22/17   Palumbo, April, MD  metroNIDAZOLE (FLAGYL) 500 MG tablet Take 1 tablet (500 mg total) by mouth 2 (two) times daily. 10/09/17   Tegeler, Canary Brim, MD    Family History Family History  Problem Relation Age of Onset  . Asthma Mother   . Asthma Maternal Grandmother     Social History Social History   Tobacco Use  . Smoking status: Current Every Day Smoker    Packs/day: 0.50  . Smokeless tobacco: Never Used  Substance Use Topics  . Alcohol use: Yes    Comment: social  . Drug use: Yes    Types: Marijuana    Comment: smells of marijuana     Allergies   Aspirin; Amoxicillin; Eggs or egg-derived products; and Benadryl [diphenhydramine hcl]   Review of Systems Review of Systems  Constitutional: Negative for chills and fever.  HENT:  Negative for congestion and rhinorrhea.   Eyes: Negative for redness and visual disturbance.  Respiratory: Negative for shortness of breath and wheezing.   Cardiovascular: Negative for chest pain and palpitations.  Gastrointestinal: Negative for nausea and vomiting.  Genitourinary: Negative for dysuria and urgency.  Musculoskeletal: Negative for arthralgias and myalgias.  Skin: Negative for pallor and wound.  Neurological: Positive for headaches. Negative for dizziness.     Physical Exam Updated Vital Signs BP 118/75 (BP Location: Right Arm)   Pulse 81   Temp 98.2 F (36.8 C) (Oral)   Resp 18   Ht 5\' 3"  (1.6 m)   Wt 65.8 kg (145 lb)   LMP 12/21/2017   SpO2 100%   BMI 25.69 kg/m   Physical Exam  Constitutional: She is oriented to person, place, and time. She appears well-developed and well-nourished. No distress.  HENT:  Head: Normocephalic and atraumatic.  Eyes: Pupils are equal, round, and  reactive to light. EOM are normal.  Neck: Normal range of motion. Neck supple.  Cardiovascular: Normal rate and regular rhythm. Exam reveals no gallop and no friction rub.  No murmur heard. Pulmonary/Chest: Effort normal. She has no wheezes. She has no rales.  Abdominal: Soft. She exhibits no distension. There is no tenderness.  Musculoskeletal: She exhibits no edema or tenderness.  Neurological: She is alert and oriented to person, place, and time. She has normal strength. No cranial nerve deficit or sensory deficit. She displays a negative Romberg sign. Coordination and gait normal.  Benign neuro exam  Skin: Skin is warm and dry. She is not diaphoretic.  Psychiatric: She has a normal mood and affect. Her behavior is normal.  Nursing note and vitals reviewed.    ED Treatments / Results  Labs (all labs ordered are listed, but only abnormal results are displayed) Labs Reviewed - No data to display  EKG None  Radiology No results found.  Procedures Procedures (including critical care time)  Medications Ordered in ED Medications  prochlorperazine (COMPAZINE) injection 10 mg (has no administration in time range)     Initial Impression / Assessment and Plan / ED Course  I have reviewed the triage vital signs and the nursing notes.  Pertinent labs & imaging results that were available during my care of the patient were reviewed by me and considered in my medical decision making (see chart for details).     27 yo F with a chief complaint of a headache.  No red flags.  Benign neuro exam.  Give Compazine discharge home.  6:25 AM:  I have discussed the diagnosis/risks/treatment options with the patient and believe the pt to be eligible for discharge home to follow-up with PCP. We also discussed returning to the ED immediately if new or worsening sx occur. We discussed the sx which are most concerning (e.g., sudden worsening pain, fever, inability to tolerate by mouth ) that  necessitate immediate return. Medications administered to the patient during their visit and any new prescriptions provided to the patient are listed below.  Medications given during this visit Medications  prochlorperazine (COMPAZINE) injection 10 mg (has no administration in time range)      The patient appears reasonably screen and/or stabilized for discharge and I doubt any other medical condition or other [facility] requiring further screening, evaluation, or treatment in the ED at this time prior to discharge.    Final Clinical Impressions(s) / ED Diagnoses   Final diagnoses:  Bad headache    ED Discharge Orders  None       Melene PlanFloyd, Raylyn Speckman, [place] 01/08/18 [phone]

## 2018-01-21 ENCOUNTER — Encounter (HOSPITAL_BASED_OUTPATIENT_CLINIC_OR_DEPARTMENT_OTHER): Payer: Self-pay | Admitting: Emergency Medicine

## 2018-01-21 ENCOUNTER — Other Ambulatory Visit: Payer: Self-pay

## 2018-01-21 ENCOUNTER — Emergency Department (HOSPITAL_BASED_OUTPATIENT_CLINIC_OR_DEPARTMENT_OTHER)
Admission: EM | Admit: 2018-01-21 | Discharge: 2018-01-21 | Disposition: A | Discharge status: Home / Self Care | Payer: Medicaid Other | Attending: Emergency Medicine | Admitting: Emergency Medicine

## 2018-01-21 DIAGNOSIS — F17200 Nicotine dependence, unspecified, uncomplicated: Secondary | ICD-10-CM | POA: Diagnosis not present

## 2018-01-21 DIAGNOSIS — K529 Noninfective gastroenteritis and colitis, unspecified: Secondary | ICD-10-CM

## 2018-01-21 DIAGNOSIS — J45909 Unspecified asthma, uncomplicated: Secondary | ICD-10-CM | POA: Diagnosis not present

## 2018-01-21 DIAGNOSIS — R112 Nausea with vomiting, unspecified: Secondary | ICD-10-CM | POA: Diagnosis present

## 2018-01-21 LAB — URINALYSIS, ROUTINE W REFLEX MICROSCOPIC
GLUCOSE, UA: NEGATIVE mg/dL
Ketones, ur: 15 mg/dL — AB
Leukocytes, UA: NEGATIVE
Nitrite: NEGATIVE
PH: 6 (ref 5.0–8.0)
Protein, ur: 100 mg/dL — AB

## 2018-01-21 LAB — URINALYSIS, MICROSCOPIC (REFLEX): RBC / HPF: >50 RBC/hpf (ref 0–5)

## 2018-01-21 LAB — PREGNANCY, URINE: Preg Test, Ur: NEGATIVE

## 2018-01-21 MED ORDER — LOPERAMIDE HCL 2 MG PO CAPS
4.0000 mg | ORAL_CAPSULE | Freq: Once | ORAL | Status: AC
  Administered 2018-01-21: 4 mg via ORAL
  Filled 2018-01-21: qty 2

## 2018-01-21 MED ORDER — ONDANSETRON 8 MG PO TBDP
8.0000 mg | ORAL_TABLET | Freq: Once | ORAL | Status: AC
  Administered 2018-01-21: 8 mg via ORAL
  Filled 2018-01-21: qty 1

## 2018-01-21 MED ORDER — ONDANSETRON 8 MG PO TBDP: 8.0000 mg | ORAL_TABLET | Freq: Three times a day (TID) | ORAL | 0 refills | Status: DC | PRN

## 2018-01-21 NOTE — ED Triage Notes (Signed)
Patient states she has had NVD this evening after eating dinner; states last episode of vomiting was 2 hours ago and last diarrhea just pta; co intermittent abd cramping.

## 2018-01-21 NOTE — ED Provider Notes (Signed)
[facility] Provider Note: Lowella Dell, MD, FACEP  CSN: [id number] MRN: [id number] ARRIVAL: 01/21/18 at 0538 ROOM: MH07/[facility]07   CHIEF COMPLAINT  Vomiting   HISTORY OF PRESENT ILLNESS  01/21/18 6:00 AM Jamie Gonzales is a 27 y.o. female who has had nausea, vomiting and diarrhea after eating dinner yesterday evening.  Her last episode of vomiting was 2 hours ago and her last diarrhea was just prior to arrival.  She has had intermittent abdominal cramping as well when she needs to move her bowels, this is not severe.    Past Medical History:  Diagnosis Date  . Asthma     History reviewed. No pertinent surgical history.  Family History  Problem Relation Age of Onset  . Asthma Mother   . Asthma Maternal Grandmother     Social History   Tobacco Use  . Smoking status: Current Every Day Smoker    Packs/day: 0.50  . Smokeless tobacco: Never Used  Substance Use Topics  . Alcohol use: Yes    Comment: social  . Drug use: Yes    Types: Marijuana    Comment: smells of marijuana    Prior to Admission medications   Medication Sig Start Date End Date Taking? Authorizing Provider  acetaminophen (TYLENOL) 500 MG tablet Take 1 tablet (500 mg total) by mouth every 6 (six) hours as needed for mild pain or moderate pain. 01/06/17   Antony Madura, PA-C  albuterol (PROVENTIL HFA;VENTOLIN HFA) 108 (90 Base) MCG/ACT inhaler Inhale 2 puffs into the lungs every 6 (six) hours as needed for wheezing or shortness of breath.    [provider]  metroNIDAZOLE (FLAGYL) 500 MG tablet Take 1 tablet (500 mg total) by mouth 2 (two) times daily. One po bid x 7 days 04/22/17   Palumbo, April, MD  metroNIDAZOLE (FLAGYL) 500 MG tablet Take 1 tablet (500 mg total) by mouth 2 (two) times daily. 10/09/17   Tegeler, Canary Brim, MD    Allergies Aspirin; Amoxicillin; Eggs or egg-derived products; and Benadryl [diphenhydramine hcl]   REVIEW OF SYSTEMS  Negative except as noted here or  in the History of Present Illness.   PHYSICAL EXAMINATION  Initial Vital Signs Blood pressure 106/78, pulse 72, temperature 98.9 F (37.2 C), temperature source Oral, resp. rate 18, height 5\' 3"  (1.6 m), last menstrual period 01/10/2018, SpO2 100 %, unknown if currently breastfeeding.  Examination General: Well-developed, well-nourished female in no acute distress; appearance consistent with age of record HENT: normocephalic; atraumatic Eyes: pupils equal, round and reactive to light; extraocular muscles intact Neck: supple Heart: regular rate and rhythm; no murmurs, rubs or gallops Lungs: clear to auscultation bilaterally Abdomen: soft; nondistended; nontender; no masses or hepatosplenomegaly; bowel sounds present Extremities: No deformity; full range of motion; pulses normal Neurologic: Awake, alert and oriented; motor function intact in all extremities and symmetric; no facial droop Skin: Warm and dry Psychiatric: Normal mood and affect   RESULTS  Summary of this visit's results, reviewed by myself:   EKG Interpretation  Date/Time:    Ventricular Rate:    PR Interval:    QRS Duration:   QT Interval:    QTC Calculation:   R Axis:     Text Interpretation:        Laboratory Studies: Results for orders placed or performed during the hospital encounter of 01/21/18 (from the past 24 hour(s))  Urinalysis, Routine w reflex microscopic     Status: Abnormal   Collection Time: 01/21/18  5:49 AM  Result Value Ref Range   Color, Urine AMBER (A) YELLOW   APPearance CLOUDY (A) CLEAR   Specific Gravity, Urine >1.030 (H) 1.005 - 1.030   pH 6.0 5.0 - 8.0   Glucose, UA NEGATIVE NEGATIVE mg/dL   Hgb urine dipstick LARGE (A) NEGATIVE   Bilirubin Urine SMALL (A) NEGATIVE   Ketones, ur 15 (A) NEGATIVE mg/dL   Protein, ur 027100 (A) NEGATIVE mg/dL   Nitrite NEGATIVE NEGATIVE   Leukocytes, UA NEGATIVE NEGATIVE  Pregnancy, urine     Status: None   Collection Time: 01/21/18  5:49 AM    Result Value Ref Range   Preg Test, Ur NEGATIVE NEGATIVE  Urinalysis, Microscopic (reflex)     Status: Abnormal   Collection Time: 01/21/18  5:49 AM  Result Value Ref Range   RBC / HPF >50 0 - 5 RBC/hpf   WBC, UA 0-5 0 - 5 WBC/hpf   Bacteria, UA MANY (A) NONE SEEN   Squamous Epithelial / LPF 6-10 0 - 5   Mucus PRESENT    Imaging Studies: No results found.  ED COURSE and MDM  Nursing notes and initial vitals signs, including pulse oximetry, reviewed.  Vitals:   01/21/18 0549  BP: 106/78  Pulse: 72  Resp: 18  Temp: 98.9 F (37.2 C)  TempSrc: Oral  SpO2: 100%  Height: 5\' 3"  (1.6 m)   The patient's friend is also here with nausea after eating at the same restaurant.  They may have contracted a foodborne illness.  We will treat her nausea and diarrhea and encourage increase fluid intake.  PROCEDURES    ED DIAGNOSES     ICD-10-CM   1. Gastroenteritis K52.9        Damiel Barthold, MD 01/21/18 [phone]

## 2018-01-24 LAB — GC/CHLAMYDIA PROBE AMP (~~LOC~~) NOT AT ARMC
CHLAMYDIA, DNA PROBE: NEGATIVE
Neisseria Gonorrhea: NEGATIVE

## 2018-01-29 ENCOUNTER — Emergency Department (HOSPITAL_BASED_OUTPATIENT_CLINIC_OR_DEPARTMENT_OTHER)
Admission: EM | Admit: 2018-01-29 | Discharge: 2018-01-29 | Disposition: A | Discharge status: Home / Self Care | Payer: Medicaid Other | Attending: Emergency Medicine | Admitting: Emergency Medicine

## 2018-01-29 ENCOUNTER — Other Ambulatory Visit: Payer: Self-pay

## 2018-01-29 ENCOUNTER — Encounter (HOSPITAL_BASED_OUTPATIENT_CLINIC_OR_DEPARTMENT_OTHER): Payer: Self-pay | Admitting: *Deleted

## 2018-01-29 DIAGNOSIS — A084 Viral intestinal infection, unspecified: Secondary | ICD-10-CM

## 2018-01-29 DIAGNOSIS — R197 Diarrhea, unspecified: Secondary | ICD-10-CM

## 2018-01-29 DIAGNOSIS — J45909 Unspecified asthma, uncomplicated: Secondary | ICD-10-CM | POA: Diagnosis not present

## 2018-01-29 DIAGNOSIS — R1110 Vomiting, unspecified: Secondary | ICD-10-CM | POA: Diagnosis present

## 2018-01-29 DIAGNOSIS — F17210 Nicotine dependence, cigarettes, uncomplicated: Secondary | ICD-10-CM | POA: Insufficient documentation

## 2018-01-29 DIAGNOSIS — R112 Nausea with vomiting, unspecified: Secondary | ICD-10-CM

## 2018-01-29 LAB — URINALYSIS, ROUTINE W REFLEX MICROSCOPIC
Bilirubin Urine: NEGATIVE
Glucose, UA: NEGATIVE mg/dL
Hgb urine dipstick: NEGATIVE
Ketones, ur: NEGATIVE mg/dL
Leukocytes, UA: NEGATIVE
Nitrite: NEGATIVE
Protein, ur: NEGATIVE mg/dL
Specific Gravity, Urine: 1.01 (ref 1.005–1.030)
pH: 8.5 — ABNORMAL HIGH (ref 5.0–8.0)

## 2018-01-29 LAB — PREGNANCY, URINE: Preg Test, Ur: NEGATIVE

## 2018-01-29 MED ORDER — ONDANSETRON 4 MG PO TBDP: 4.0000 mg | ORAL_TABLET | Freq: Three times a day (TID) | ORAL | 0 refills | Status: DC | PRN

## 2018-01-29 MED ORDER — ONDANSETRON 4 MG PO TBDP
4.0000 mg | ORAL_TABLET | Freq: Once | ORAL | Status: AC | PRN
  Administered 2018-01-29: 4 mg via ORAL
  Filled 2018-01-29: qty 1

## 2018-01-29 NOTE — ED Notes (Signed)
ED Provider at bedside. 

## 2018-01-29 NOTE — ED Provider Notes (Signed)
[facility] EMERGENCY DEPARTMENT Provider Note   CSN: [id number] Arrival date & time: 01/29/18  1935     History   Chief Complaint Chief Complaint  Patient presents with  . Emesis    HPI Jamie Gonzales is a 27 y.o. female with a PMHx of asthma, who presents to the ED with complaints of "I think I got the virus my little cousin has"; pt states she started having gradual onset constant n/v/d beginning last night and believes she "caught" the "virus" he has.  She reports having 6 episodes of nonbloody nonbilious emesis today and 3 episodes of nonbloody watery diarrhea today.  She reports associated chills.  She has used Zofran which has helped her symptoms, no known aggravating factors.  She did not take anything else prior to arrival.  Upon arrival she was given another dose of Zofran, states that it continues to help, but has not tried to drink or eat anything however she wants to try now.  Of note, chart review reveals that she was seen in the ED on 01/21/18 for similar complaints after eating at a restaurant, at that visit her U/A was grossly contaminated but didn't have any signs of definite UTI, Upreg was neg, and GC/CT testing was negative (no pelvic performed).  She received zofran and loperamide and improved, so she was discharged home with zofran 8mg  ODT rx.  No other labs/imaging were done at that time.  She states that that visit was for "food poisoning" and she improved quickly after the visit; she had been doing well until last night when these symptoms started.  She denies fevers, CP, SOB, abd pain, constipation, obstipation, melena, hematochezia, hematemesis, hematuria, dysuria, vaginal bleeding/discharge, myalgias, arthralgias, numbness, tingling, focal weakness, or any other complaints at this time. Denies recent travel, suspicious food intake, EtOH use, NSAID use, recent abx, or prior abd surgeries.   The history is provided by the patient and medical records. No  language interpreter was used.  Emesis   Associated symptoms include chills and diarrhea. Pertinent negatives include no abdominal pain, no arthralgias, no fever and no myalgias.    Past Medical History:  Diagnosis Date  . Asthma     There are no active problems to display for this patient.   History reviewed. No pertinent surgical history.   OB History    Gravida  2   Para      Term      Preterm      AB  1   Living        SAB  1   TAB      Ectopic      Multiple      Live Births               Home Medications    Prior to Admission medications   Medication Sig Start Date End Date Taking? Authorizing Provider  albuterol (PROVENTIL HFA;VENTOLIN HFA) 108 (90 Base) MCG/ACT inhaler Inhale 2 puffs into the lungs every 6 (six) hours as needed for wheezing or shortness of breath.    [provider]  ondansetron (ZOFRAN ODT) 8 MG disintegrating tablet Take 1 tablet (8 mg total) by mouth every 8 (eight) hours as needed for nausea or vomiting. 01/21/18   Molpus, Jonny Ruiz, MD    Family History Family History  Problem Relation Age of Onset  . Asthma Mother   . Asthma Maternal Grandmother     Social History Social History   Tobacco Use  .  Smoking status: Current Every Day Smoker    Packs/day: 0.50    Types: Cigarettes  . Smokeless tobacco: Never Used  Substance Use Topics  . Alcohol use: Yes    Comment: social  . Drug use: Yes    Types: Marijuana    Comment: smells of marijuana     Allergies   Aspirin; Amoxicillin; Eggs or egg-derived products; and Benadryl [diphenhydramine hcl]   Review of Systems Review of Systems  Constitutional: Positive for chills. Negative for fever.  Respiratory: Negative for shortness of breath.   Cardiovascular: Negative for chest pain.  Gastrointestinal: Positive for diarrhea, nausea and vomiting. Negative for abdominal pain, blood in stool and constipation.  Genitourinary: Negative for dysuria, hematuria, vaginal  bleeding and vaginal discharge.  Musculoskeletal: Negative for arthralgias and myalgias.  Skin: Negative for color change.  Allergic/Immunologic: Negative for immunocompromised state.  Neurological: Negative for weakness and numbness.  Psychiatric/Behavioral: Negative for confusion.   All other systems reviewed and are negative for acute change except as noted in the HPI.    Physical Exam Updated Vital Signs BP 130/83 (BP Location: Left Arm)   Pulse 60   Temp 98.4 F (36.9 C)   Resp 18   Ht 5\' 3"  (1.6 m)   Wt 65.8 kg (145 lb)   LMP 01/10/2018   SpO2 100%   Breastfeeding? No   BMI 25.69 kg/m   Physical Exam  Constitutional: She is oriented to person, place, and time. Vital signs are normal. She appears well-developed and well-nourished.  Non-toxic appearance. No distress.  Afebrile, nontoxic, NAD  HENT:  Head: Normocephalic and atraumatic.  Mouth/Throat: Oropharynx is clear and moist and mucous membranes are normal.  Eyes: Conjunctivae and EOM are normal. Right eye exhibits no discharge. Left eye exhibits no discharge.  Neck: Normal range of motion. Neck supple.  Cardiovascular: Normal rate, regular rhythm, normal heart sounds and intact distal pulses. Exam reveals no gallop and no friction rub.  No murmur heard. Pulmonary/Chest: Effort normal and breath sounds normal. No respiratory distress. She has no decreased breath sounds. She has no wheezes. She has no rhonchi. She has no rales.  Abdominal: Soft. Normal appearance and bowel sounds are normal. She exhibits no distension. There is no tenderness. There is no rigidity, no rebound, no guarding, no CVA tenderness, no tenderness at McBurney's point and negative Murphy's sign.  Soft, NTND, +BS throughout, no r/g/r, neg murphy's, neg mcburney's, no CVA TTP   Musculoskeletal: Normal range of motion.  Neurological: She is alert and oriented to person, place, and time. She has normal strength. No sensory deficit.  Skin: Skin is warm,  dry and intact. No rash noted.  Psychiatric: She has a normal mood and affect.  Nursing note and vitals reviewed.    ED Treatments / Results  Labs (all labs ordered are listed, but only abnormal results are displayed) Labs Reviewed  URINALYSIS, ROUTINE W REFLEX MICROSCOPIC - Abnormal; Notable for the following components:      Result Value   pH 8.5 (*)    All other components within normal limits  PREGNANCY, URINE     EKG None  Radiology No results found.  Procedures Procedures (including critical care time)  Medications Ordered in ED Medications  ondansetron (ZOFRAN-ODT) disintegrating tablet 4 mg (4 mg Oral Given 01/29/18 2203)     Initial Impression / Assessment and Plan / ED Course  I have reviewed the triage vital signs and the nursing notes.  Pertinent labs & imaging results that  were available during my care of the patient were reviewed by me and considered in my medical decision making (see chart for details).     27 y.o. female here with n/v/d x1 day, states sick contacts at home with similar symptoms, thinks she got "a virus" that her cousin has. On exam, no abdominal tenderness, afebrile and nontoxic appearing, in NAD. Work up thus far reveals: U/A unremarkable and without evidence of UTI. Upreg neg. Interestingly, she was seen on 01/21/18 for similar symptoms after she ate at a restaurant, at that time her U/A was grossly contaminated but didn't have any definite evidence of UTI, Upreg neg, GC/CT done on urine was negative (no pelvic done), no other labs/imaging done, she was given zofran and loperamide and improved so she was discharged with zofran. States she had improved from that, until yesterday when these symptoms began. Overall, sounds like likely viral gastroenteritis, pt nontoxic and well appearing without concerning s/sx, doubt need for further emergent work up at this time. Will PO challenge, if tolerating PO then will d/c home. Will reassess shortly.    11:33 PM Pt feeling improved and tolerating PO well here. Likely viral gastroenteritis. Discussed OTC remedies for symptomatic relief, BRAT diet, adequate hydration, and will rx zofran. Advised tylenol/motrin for pain. F/up with PCP in 1wk for recheck of symptoms. I explained the diagnosis and have given explicit precautions to return to the ER including for any other new or worsening symptoms. The patient understands and accepts the medical plan as it's been dictated and I have answered their questions. Discharge instructions concerning home care and prescriptions have been given. The patient is STABLE and is discharged to home in good condition.     Final Clinical Impressions(s) / ED Diagnoses   Final diagnoses:  Nausea vomiting and diarrhea  Viral gastroenteritis    ED Discharge Orders        Ordered    ondansetron (ZOFRAN ODT) 4 MG disintegrating tablet  Every 8 hours PRN     01/29/18 [address] [place], [place] 01/29/18 2334    Raeford Razor, MD 01/30/18 [phone]

## 2018-01-29 NOTE — ED Notes (Signed)
Pt states she is "just ready to go". Advised to wait for EDP and pt agreed to do so.

## 2018-01-29 NOTE — ED Notes (Signed)
Pt able to keep juice and crackers down and states she feels ok.

## 2018-01-29 NOTE — ED Notes (Signed)
Pt given d/c instructions as per chart. Rx x 1. Verbalizes understanding. No questions. 

## 2018-01-29 NOTE — ED Notes (Signed)
Pt states her cousin was sick with similar s/s and she thinks she picked up his virus. Vomited x 6 today. Took Zofran with relief and is requesting a Rx for same. Denies other s/s. Does not want to put on gown.

## 2018-01-29 NOTE — ED Triage Notes (Signed)
C/o n/v/d x 1 day. Denies nausea at present

## 2018-01-29 NOTE — ED Notes (Signed)
Pt given Apple Juice and saltines.

## 2018-01-29 NOTE — Discharge Instructions (Signed)
Use zofran as prescribed, as needed for nausea. Alternate between tylenol and motrin as needed for pain. May consider using over the counter tums, maalox, pepto bismol, or other over the counter remedies to help with symptoms. Stay well hydrated with small sips of fluids throughout the day. Follow a BRAT (banana-rice-applesauce-toast) diet as described below for the next 24-48 hours. The 'BRAT' diet is suggested, then progress to diet as tolerated as symptoms abate. Call your regular doctor if bloody stools, persistent diarrhea, vomiting, fever or abdominal pain. Follow up with your regular doctor in 1 week for recheck of symptoms. Return to ER for changing or worsening of symptoms.   °

## 2018-04-20 ENCOUNTER — Encounter (HOSPITAL_BASED_OUTPATIENT_CLINIC_OR_DEPARTMENT_OTHER): Payer: Self-pay

## 2018-04-20 ENCOUNTER — Other Ambulatory Visit: Payer: Self-pay

## 2018-04-20 ENCOUNTER — Emergency Department (HOSPITAL_BASED_OUTPATIENT_CLINIC_OR_DEPARTMENT_OTHER)
Admission: EM | Admit: 2018-04-20 | Discharge: 2018-04-20 | Disposition: A | Discharge status: Home / Self Care | Payer: Medicaid Other | Attending: Emergency Medicine | Admitting: Emergency Medicine

## 2018-04-20 DIAGNOSIS — B9689 Other specified bacterial agents as the cause of diseases classified elsewhere: Secondary | ICD-10-CM

## 2018-04-20 DIAGNOSIS — L02415 Cutaneous abscess of right lower limb: Secondary | ICD-10-CM | POA: Diagnosis not present

## 2018-04-20 DIAGNOSIS — L0291 Cutaneous abscess, unspecified: Secondary | ICD-10-CM

## 2018-04-20 DIAGNOSIS — N898 Other specified noninflammatory disorders of vagina: Secondary | ICD-10-CM | POA: Diagnosis present

## 2018-04-20 DIAGNOSIS — F17210 Nicotine dependence, cigarettes, uncomplicated: Secondary | ICD-10-CM | POA: Diagnosis not present

## 2018-04-20 DIAGNOSIS — N760 Acute vaginitis: Secondary | ICD-10-CM | POA: Diagnosis not present

## 2018-04-20 DIAGNOSIS — J45909 Unspecified asthma, uncomplicated: Secondary | ICD-10-CM | POA: Diagnosis not present

## 2018-04-20 LAB — URINALYSIS, ROUTINE W REFLEX MICROSCOPIC
BILIRUBIN URINE: NEGATIVE
GLUCOSE, UA: NEGATIVE mg/dL
HGB URINE DIPSTICK: NEGATIVE
KETONES UR: NEGATIVE mg/dL
Leukocytes, UA: NEGATIVE
Nitrite: NEGATIVE
PH: 6.5 (ref 5.0–8.0)
Protein, ur: NEGATIVE mg/dL
Specific Gravity, Urine: <1.005 — ABNORMAL LOW (ref 1.005–1.030)

## 2018-04-20 LAB — WET PREP, GENITAL
SPERM: NONE SEEN
Trich, Wet Prep: NONE SEEN
Yeast Wet Prep HPF POC: NONE SEEN

## 2018-04-20 LAB — PREGNANCY, URINE: Preg Test, Ur: NEGATIVE

## 2018-04-20 MED ORDER — METRONIDAZOLE 500 MG PO TABS: 500.0000 mg | ORAL_TABLET | Freq: Two times a day (BID) | ORAL | 0 refills | Status: DC

## 2018-04-20 MED ORDER — LIDOCAINE-EPINEPHRINE (PF) 2 %-1:200000 IJ SOLN
10.0000 mL | Freq: Once | INTRAMUSCULAR | Status: AC
  Administered 2018-04-20: 10 mL
  Filled 2018-04-20 (×2): qty 10

## 2018-04-20 NOTE — ED Notes (Signed)
Pt discharged to home NAD.  

## 2018-04-20 NOTE — Discharge Instructions (Signed)
Please read and follow all provided instructions.  You were seen here today for an Abscess . For this, an incision and drainage (aka an I&D) to the affected area was done today. An I&D is a surgical procedure to open and drain a fluid-filled sac that may be filled with pus, mucus, or blood. Examples of fluid-filled sacs that may need surgical drainage include cysts, skin infections (abscesses), and red lumps that develop from a ruptured cyst or a small abscess (boils).  Home instructions  1. Treatment: Keep wound clean and dry. Apply warm compresses to the area throughout the day. It will continue to drain over the follow days.  For pain control you may take: 800mg  of ibuprofen (that is usually four 200mg  over the counter pills) up to 3 times a day (please take with food) and acetaminophen 975mg  (this is 3 normal strength, 325mg , over the counter pills) up to four times a day. Please do not take more than this. Do not drink alcohol or combine with other medications that have acetaminophen as an ingredient (Read the labels!).    Follow Up:  Follow-up with your Primary Care Provider or [facility] in 2 days for wound recheck.  Return to emergency department for emergent changing or worsening symptoms.  Return instructions:  Return to the Emergency Department if you have: Fever You have more redness, swelling, or pain around your incision.  Your incision feels warm to touch Redness of the skin that moves away from the affected area, especially if it streaks away from the affected area  The area where the incision and drainage occurred becomes numb or it tingles. Any other emergent concerns  Additional Information: You were found to have bacterial vaginosis. Please see attached handout on this.  Please take Flagyl as prescribed.  Please not drink alcohol while taking this medication as will make you feel ill.  Your HIV, syphilis, gonorrhea and chlamydia cultures are pending.  Please  follow-up with your OB/GYN for further concerns on this.  Please follow-up with the health department for future testing  Your vital signs today were: BP 107/68 (BP Location: Right Arm)    Pulse (!) 54    Temp 98.7 F (37.1 C) (Oral)    Resp 16    Ht 5\' 3"  (1.6 m)    Wt 56.7 kg    LMP 04/08/2018    SpO2 98%    BMI 22.14 kg/m  If your blood pressure (BP) was elevated above 135/85 this visit, please have this repeated by your doctor within one month. ---------------

## 2018-04-20 NOTE — ED Provider Notes (Signed)
[facility] EMERGENCY DEPARTMENT Provider Note   CSN: [id number] Arrival date & time: 04/20/18  8413     History   Chief Complaint Chief Complaint  Patient presents with  . Vaginal Discharge    HPI Jamie Gonzales is a 27 y.o. female with a history of asthma who presents emergency department today for vaginal discharge.  Patient reports that 2 days ago she started having a thick, white, malodorous discharge along with some lower pelvic pain.  She reports she is sexually active with one female partner does not always use protection.  She denies any history of STDs.  Patient states she would like to be tested for all STDs however.  She denies any associated fever, chills, abdominal pain, flank pain, urinary symptoms, nausea/vomiting/diarrhea.  No previous abdominal surgeries.  Patient also reports that she has an abscess to her left inner thigh that began approximately 1 week ago.  She notes that it started draining yesterday purulent material.  No surrounding erythema or warmth.  She denies any fever, chills, nausea or vomiting.  Please started from an ingrown hair and after shaving.  Denies history of the same.  No history of immunocompromise.  HPI  Past Medical History:  Diagnosis Date  . Asthma     There are no active problems to display for this patient.   History reviewed. No pertinent surgical history.   OB History    Gravida  2   Para      Term      Preterm      AB  1   Living        SAB  1   TAB      Ectopic      Multiple      Live Births               Home Medications    Prior to Admission medications   Medication Sig Start Date End Date Taking? Authorizing Provider  albuterol (PROVENTIL HFA;VENTOLIN HFA) 108 (90 Base) MCG/ACT inhaler Inhale 2 puffs into the lungs every 6 (six) hours as needed for wheezing or shortness of breath.    [provider]  ondansetron (ZOFRAN ODT) 4 MG disintegrating tablet Take 1 tablet (4 mg  total) by mouth every 8 (eight) hours as needed for nausea or vomiting. 01/29/18   Street, [place], PA-C  ondansetron (ZOFRAN ODT) 8 MG disintegrating tablet Take 1 tablet (8 mg total) by mouth every 8 (eight) hours as needed for nausea or vomiting. 01/21/18   Molpus, Jonny Ruiz, MD    Family History Family History  Problem Relation Age of Onset  . Asthma Mother   . Asthma Maternal Grandmother     Social History Social History   Tobacco Use  . Smoking status: Current Every Day Smoker    Packs/day: 0.50    Types: Cigarettes  . Smokeless tobacco: Never Used  Substance Use Topics  . Alcohol use: Yes    Comment: social  . Drug use: Yes    Types: Marijuana    Comment: smells of marijuana     Allergies   Aspirin; Amoxicillin; Eggs or egg-derived products; and Benadryl [diphenhydramine hcl]   Review of Systems Review of Systems  All other systems reviewed and are negative.    Physical Exam Updated Vital Signs BP 120/87 (BP Location: Right Arm)   Pulse (!) 59   Temp 98.7 F (37.1 C) (Oral)   Resp 16   Ht 5\' 3"  (1.6 m)  Wt 56.7 kg   LMP 04/08/2018   SpO2 100%   BMI 22.14 kg/m   Physical Exam  Constitutional: She appears well-developed and well-nourished.  HENT:  Head: Normocephalic and atraumatic.  Right Ear: External ear normal.  Left Ear: External ear normal.  Nose: Nose normal.  Mouth/Throat: Uvula is midline, oropharynx is clear and moist and mucous membranes are normal. No tonsillar exudate.  Eyes: Pupils are equal, round, and reactive to light. Right eye exhibits no discharge. Left eye exhibits no discharge. No scleral icterus.  Neck: Trachea normal. Neck supple. No spinous process tenderness present. No neck rigidity. Normal range of motion present.  Cardiovascular: Normal rate, regular rhythm and intact distal pulses.  No murmur heard. Pulses:      Radial pulses are 2+ on the right side, and 2+ on the left side.       Dorsalis pedis pulses are 2+ on the  right side, and 2+ on the left side.       Posterior tibial pulses are 2+ on the right side, and 2+ on the left side.  No lower extremity swelling or edema. Calves symmetric in size bilaterally.  Pulmonary/Chest: Effort normal and breath sounds normal. She exhibits no tenderness.  Abdominal: Soft. Bowel sounds are normal. She exhibits no distension. There is no tenderness. There is no rigidity, no rebound, no guarding, no CVA tenderness, no tenderness at McBurney's point and negative Murphy's sign.  Negative McBurney's, Rovsing's, Psoas and Obturator signs.  Genitourinary:     Genitourinary Comments: Exam performed by Jacinto Halim, exam chaperoned Pelvic exam: normal external genitalia without evidence of trauma. VULVA: normal appearing vulva with no masses, tenderness or lesion. VAGINA: normal appearing vagina with normal color and discharge, no lesions. CERVIX: normal appearing cervix without lesions, cervical motion tenderness absent, cervical os closed with out purulent discharge; vaginal discharge - white and creamy, Wet prep and DNA probe for chlamydia and GC obtained.   ADNEXA: normal adnexa in size, nontender and no masses UTERUS: uterus is normal size, shape, consistency and nontender.   Musculoskeletal: She exhibits no edema.  Lymphadenopathy:    She has no cervical adenopathy.  Neurological: She is alert.  Skin: Skin is warm and dry. No rash noted. She is not diaphoretic.  Psychiatric: She has a normal mood and affect.  Nursing note and vitals reviewed.    ED Treatments / Results  Labs (all labs ordered are listed, but only abnormal results are displayed) Labs Reviewed  WET PREP, GENITAL - Abnormal; Notable for the following components:      Result Value   Clue Cells Wet Prep HPF POC PRESENT (*)    WBC, Wet Prep HPF POC MODERATE (*)    All other components within normal limits  URINALYSIS, ROUTINE W REFLEX MICROSCOPIC - Abnormal; Notable for the following  components:   Color, Urine STRAW (*)    Specific Gravity, Urine <1.005 (*)    All other components within normal limits  PREGNANCY, URINE  RPR  HIV ANTIBODY (ROUTINE TESTING W REFLEX)  GC/CHLAMYDIA PROBE AMP ([place]) NOT AT [facility]    EKG None  Radiology No results found.  Procedures .Marland KitchenIncision and Drainage Date/Time: 04/20/2018 2:23 PM Performed by: Jacinto Halim, PA-C Authorized by: Jacinto Halim, PA-C   Consent:    Consent obtained:  Verbal   Consent given by:  Patient   Risks discussed:  Bleeding, damage to other organs, infection, pain and incomplete drainage   Alternatives discussed:  No  treatment Location:    Type:  Abscess   Size:  1cm   Location:  Lower extremity   Lower extremity location:  Leg   Leg location:  L upper leg Pre-procedure details:    Skin preparation:  Betadine Anesthesia (see MAR for exact dosages):    Anesthesia method:  Local infiltration   Local anesthetic:  Lidocaine 2% WITH epi Procedure type:    Complexity:  Simple Procedure details:    Incision types:  Single straight   Scalpel blade:  11   Wound management:  Probed and deloculated   Drainage:  Purulent and bloody   Drainage amount:  Moderate   Wound treatment:  Wound left open   Packing materials:  None Post-procedure details:    Patient tolerance of procedure:  Tolerated well, no immediate complications   (including critical care time)  Medications Ordered in ED Medications  lidocaine-EPINEPHrine (XYLOCAINE W/EPI) 2 %-1:200000 (PF) injection 10 mL (10 mLs Infiltration Given 04/20/18 1205)     Initial Impression / Assessment and Plan / ED Course  I have reviewed the triage vital signs and the nursing notes.  Pertinent labs & imaging results that were available during my care of the patient were reviewed by me and considered in my medical decision making (see chart for details).     27 year old female presenting with vaginal discharge.  Lab work reveals  evidence of bacterial vaginosis.  No evidence of UTI, yeast infection, trichomonas.  Patient states she would not like to be prophylactically treated for gonorrhea or chlamydia.  She will await cultures.  Gonorrhea, chlamydia, RPR and HIV cultures are pending.  Her exam is without any concern for PID.  No indication for pelvic ultrasound at this time.  Patient without any abdominal tenderness on exam.  UA without UTI. Vital signs are reassuring.  No further work-up indicated.  Patient will be treated with Flagyl.  Discussed with patient not to drink alcohol taking this medication.  She is to follow-up with PCP/OBGYN.  Patient with skin abscess amenable to incision and drainage.  Abscess was not large enough to warrant packing or drain,  wound recheck in 2 days. Encouraged home warm soaks and flushing.  Mild signs of cellulitis is surrounding skin.  Will d/c to home.  No antibiotic therapy is indicated.  I advised the patient to follow-up with pcp. Specific return precautions discussed. Time was given for all questions to be answered. The patient verbalized understanding and agreement with plan. The patient appears safe for discharge home.  Final Clinical Impressions(s) / ED Diagnoses   Final diagnoses:  BV (bacterial vaginosis)  Abscess    ED Discharge Orders         Ordered    metroNIDAZOLE (FLAGYL) 500 MG tablet  2 times daily,   Status:  Discontinued     04/20/18 1428    metroNIDAZOLE (FLAGYL) 500 MG tablet  2 times daily     04/20/18 1449           Princella Pellegrini 04/20/18 1529    Arby Barrette, MD 04/29/18 1556

## 2018-04-20 NOTE — ED Triage Notes (Signed)
Pt c/o abdominal pain and vaginal discharge with a odor x3 days, also has an abscess to inner lt thigh

## 2018-04-21 LAB — RPR: RPR Ser Ql: NONREACTIVE

## 2018-04-21 LAB — GC/CHLAMYDIA PROBE AMP (~~LOC~~) NOT AT ARMC
CHLAMYDIA, DNA PROBE: NEGATIVE
NEISSERIA GONORRHEA: NEGATIVE

## 2018-04-21 LAB — HIV ANTIBODY (ROUTINE TESTING W REFLEX): HIV Screen 4th Generation wRfx: NONREACTIVE

## 2018-09-24 ENCOUNTER — Emergency Department (HOSPITAL_BASED_OUTPATIENT_CLINIC_OR_DEPARTMENT_OTHER): Payer: Medicaid Other

## 2018-09-24 ENCOUNTER — Emergency Department (HOSPITAL_BASED_OUTPATIENT_CLINIC_OR_DEPARTMENT_OTHER)
Admission: EM | Admit: 2018-09-24 | Discharge: 2018-09-24 | Disposition: A | Discharge status: Home / Self Care | Payer: Medicaid Other | Attending: Emergency Medicine | Admitting: Emergency Medicine

## 2018-09-24 ENCOUNTER — Encounter (HOSPITAL_BASED_OUTPATIENT_CLINIC_OR_DEPARTMENT_OTHER): Payer: Self-pay | Admitting: Emergency Medicine

## 2018-09-24 ENCOUNTER — Other Ambulatory Visit: Payer: Self-pay

## 2018-09-24 DIAGNOSIS — J4521 Mild intermittent asthma with (acute) exacerbation: Secondary | ICD-10-CM | POA: Diagnosis not present

## 2018-09-24 DIAGNOSIS — F17210 Nicotine dependence, cigarettes, uncomplicated: Secondary | ICD-10-CM | POA: Insufficient documentation

## 2018-09-24 DIAGNOSIS — R0602 Shortness of breath: Secondary | ICD-10-CM | POA: Diagnosis present

## 2018-09-24 MED ORDER — METHYLPREDNISOLONE SODIUM SUCC 125 MG IJ SOLR
125.0000 mg | Freq: Once | INTRAMUSCULAR | Status: AC
  Administered 2018-09-24: 125 mg via INTRAMUSCULAR
  Filled 2018-09-24: qty 2

## 2018-09-24 MED ORDER — PREDNISONE 10 MG PO TABS: 40.0000 mg | ORAL_TABLET | Freq: Every day | ORAL | 0 refills | Status: AC

## 2018-09-24 MED ORDER — ALBUTEROL SULFATE HFA 108 (90 BASE) MCG/ACT IN AERS: 2.0000 | INHALATION_SPRAY | Freq: Four times a day (QID) | RESPIRATORY_TRACT | 0 refills | Status: AC | PRN

## 2018-09-24 MED ORDER — IPRATROPIUM-ALBUTEROL 0.5-2.5 (3) MG/3ML IN SOLN
3.0000 mL | RESPIRATORY_TRACT | Status: AC
  Administered 2018-09-24 (×3): 3 mL via RESPIRATORY_TRACT
  Filled 2018-09-24: qty 9

## 2018-09-24 NOTE — ED Triage Notes (Signed)
SOB since this morning. Hx of asthma, used inhaler without relief.

## 2018-09-24 NOTE — ED Provider Notes (Signed)
[facility] EMERGENCY DEPARTMENT Provider Note   CSN: [id number] Arrival date & time: 09/24/18  1432    History   Chief Complaint Chief Complaint  Patient presents with  . Shortness of Breath    HPI Jamie Gonzales is a 28 y.o. female.     Patient is a 26-year female with past medical history of asthma presenting to the emergency department for shortness of breath.  She reports that this morning she began to feel like her asthma was flaring up.  She used her albuterol inhaler multiple times without relief.  She denies any other URI symptoms including cough, fever, nasal congestion.  Denies sick contacts.  Denies any chest pain but reports she has chest tightness.  She reports that she knows that this is her asthma flaring up because this is how she usually feels.  She was last seen in the emergency department for the same about a month ago.     Past Medical History:  Diagnosis Date  . Asthma     There are no active problems to display for this patient.   History reviewed. No pertinent surgical history.   OB History    Gravida  2   Para      Term      Preterm      AB  1   Living        SAB  1   TAB      Ectopic      Multiple      Live Births               Home Medications    Prior to Admission medications   Medication Sig Start Date End Date Taking? Authorizing Provider  albuterol (PROVENTIL HFA;VENTOLIN HFA) 108 (90 Base) MCG/ACT inhaler Inhale 2 puffs into the lungs every 6 (six) hours as needed for up to 30 days for wheezing or shortness of breath. 09/24/18 10/24/18  Ronnie Doss A, PA-C  metroNIDAZOLE (FLAGYL) 500 MG tablet Take 1 tablet (500 mg total) by mouth 2 (two) times daily. 04/20/18   Maczis, Elmer Sow, PA-C  ondansetron (ZOFRAN ODT) 4 MG disintegrating tablet Take 1 tablet (4 mg total) by mouth every 8 (eight) hours as needed for nausea or vomiting. 01/29/18   Street, Breaks, PA-C  ondansetron (ZOFRAN ODT) 8 MG  disintegrating tablet Take 1 tablet (8 mg total) by mouth every 8 (eight) hours as needed for nausea or vomiting. 01/21/18   Molpus, Jonny Ruiz, MD  predniSONE (DELTASONE) 10 MG tablet Take 4 tablets (40 mg total) by mouth daily for 5 days. 09/24/18 09/29/18  Arlyn Dunning, PA-C    Family History Family History  Problem Relation Age of Onset  . Asthma Mother   . Asthma Maternal Grandmother     Social History Social History   Tobacco Use  . Smoking status: Current Every Day Smoker    Packs/day: 0.50    Types: Cigarettes  . Smokeless tobacco: Never Used  Substance Use Topics  . Alcohol use: Yes    Comment: social  . Drug use: Yes    Types: Marijuana    Comment: smells of marijuana     Allergies   Aspirin; Amoxicillin; Eggs or egg-derived products; and Benadryl [diphenhydramine hcl]   Review of Systems Review of Systems  Constitutional: Negative for activity change, appetite change, fatigue and fever.  HENT: Negative for congestion, ear pain, rhinorrhea, sinus pain and sore throat.   Eyes: Negative for pain and  visual disturbance.  Respiratory: Positive for chest tightness, shortness of breath and wheezing. Negative for apnea, cough, choking and stridor.   Cardiovascular: Negative for chest pain, palpitations and leg swelling.  Gastrointestinal: Negative for abdominal pain, nausea and vomiting.  Genitourinary: Negative for dysuria and hematuria.  Musculoskeletal: Negative for arthralgias and back pain.  Skin: Negative for color change and rash.  Neurological: Negative for seizures and syncope.  All other systems reviewed and are negative.    Physical Exam Updated Vital Signs BP (!) 115/52   Pulse 74   Temp 98.1 F (36.7 C)   Resp 20   Ht 5\' 3"  (1.6 m)   Wt 65.8 kg   LMP 09/06/2018   SpO2 100%   BMI 25.69 kg/m   Physical Exam Vitals signs and nursing note reviewed.  Constitutional:      General: She is not in acute distress.    Appearance: Normal appearance. She  is not ill-appearing, toxic-appearing or diaphoretic.     Comments: The room smells of marijuana when I enter  HENT:     Head: Normocephalic.  Eyes:     Conjunctiva/sclera: Conjunctivae normal.     Pupils: Pupils are equal, round, and reactive to light.  Cardiovascular:     Rate and Rhythm: Normal rate and regular rhythm.  Pulmonary:     Effort: Tachypnea (Mild) present.     Breath sounds: No stridor. Examination of the right-lower field reveals decreased breath sounds and wheezing. Examination of the left-lower field reveals decreased breath sounds and wheezing. Decreased breath sounds and wheezing present.  Chest:     Chest wall: No mass or tenderness.  Skin:    General: Skin is dry.  Neurological:     Mental Status: She is alert.  Psychiatric:        Mood and Affect: Mood normal.      ED Treatments / Results  Labs (all labs ordered are listed, but only abnormal results are displayed) Labs Reviewed - No data to display  EKG None  Radiology Dg Chest 2 View  Result Date: 09/24/2018 CLINICAL DATA:  Shortness of breath EXAM: CHEST - 2 VIEW COMPARISON:  12/02/2016 FINDINGS: The heart size and mediastinal contours are within normal limits. Both lungs are clear. The visualized skeletal structures are unremarkable. IMPRESSION: No active cardiopulmonary disease. Electronically Signed   By: Alcide Clever M.D.   On: 09/24/2018 15:13    Procedures Procedures (including critical care time)  Medications Ordered in ED Medications  ipratropium-albuterol (DUONEB) 0.5-2.5 (3) MG/3ML nebulizer solution 3 mL (3 mLs Nebulization Given 09/24/18 1533)  methylPREDNISolone sodium succinate (SOLU-MEDROL) 125 mg/2 mL injection 125 mg (125 mg Intramuscular Given 09/24/18 1454)     Initial Impression / Assessment and Plan / ED Course  I have reviewed the triage vital signs and the nursing notes.  Pertinent labs & imaging results that were available during my care of the patient were reviewed by  me and considered in my medical decision making (see chart for details).  Clinical Course as of Sep 24 1618  Sat Sep 24, 2018  1543 Patient is feeling much better after duonebs. Chest xray clear. Will send home with rx for prednisone and albuterol inhaler. Advised no smoking.    [KM]    Clinical Course User Index [KM] Arlyn Dunning, PA-C       Based on review of vitals, medical screening exam, lab work and/or imaging, there does not appear to be an acute, emergent etiology for the  patient's symptoms. Counseled pt on good return precautions and encouraged both PCP and ED follow-up as needed.  Prior to discharge, I also discussed incidental imaging findings with patient in detail and advised appropriate, recommended follow-up in detail.  Clinical Impression: 1. Mild intermittent asthma with exacerbation     Disposition: Discharge  Prior to providing a prescription for a controlled substance, I independently reviewed the patient's recent prescription history on the [place] Controlled Substance Reporting System. The patient had no recent or regular prescriptions and was deemed appropriate for a brief, less than 3 day prescription of narcotic for acute analgesia.  This note was prepared with assistance of Conservation officer, historic buildings. Occasional wrong-word or sound-a-like substitutions may have occurred due to the inherent limitations of voice recognition software.   Final Clinical Impressions(s) / ED Diagnoses   Final diagnoses:  Mild intermittent asthma with exacerbation    ED Discharge Orders         Ordered    albuterol (PROVENTIL HFA;VENTOLIN HFA) 108 (90 Base) MCG/ACT inhaler  Every 6 hours PRN     09/24/18 1546    predniSONE (DELTASONE) 10 MG tablet  Daily     09/24/18 1546           Jeral Pinch 09/24/18 1620    Linwood Dibbles, MD 09/25/18 [phone]

## 2019-01-14 ENCOUNTER — Ambulatory Visit (HOSPITAL_BASED_OUTPATIENT_CLINIC_OR_DEPARTMENT_OTHER)
Admission: RE | Admit: 2019-01-14 | Discharge: 2019-01-14 | Disposition: A | Discharge status: Home / Self Care | Payer: Medicaid Other | Source: Ambulatory Visit | Attending: Emergency Medicine | Admitting: Emergency Medicine

## 2019-01-14 ENCOUNTER — Encounter (HOSPITAL_BASED_OUTPATIENT_CLINIC_OR_DEPARTMENT_OTHER): Payer: Self-pay | Admitting: Emergency Medicine

## 2019-01-14 ENCOUNTER — Other Ambulatory Visit: Payer: Self-pay

## 2019-01-14 ENCOUNTER — Emergency Department (HOSPITAL_BASED_OUTPATIENT_CLINIC_OR_DEPARTMENT_OTHER)
Admission: EM | Admit: 2019-01-14 | Discharge: 2019-01-14 | Disposition: A | Discharge status: Home / Self Care | Payer: Medicaid Other | Attending: Emergency Medicine | Admitting: Emergency Medicine

## 2019-01-14 ENCOUNTER — Other Ambulatory Visit (HOSPITAL_BASED_OUTPATIENT_CLINIC_OR_DEPARTMENT_OTHER): Payer: Self-pay | Admitting: Internal Medicine

## 2019-01-14 DIAGNOSIS — R1030 Lower abdominal pain, unspecified: Secondary | ICD-10-CM

## 2019-01-14 DIAGNOSIS — F17210 Nicotine dependence, cigarettes, uncomplicated: Secondary | ICD-10-CM | POA: Diagnosis not present

## 2019-01-14 DIAGNOSIS — R102 Pelvic and perineal pain: Secondary | ICD-10-CM

## 2019-01-14 DIAGNOSIS — B9689 Other specified bacterial agents as the cause of diseases classified elsewhere: Secondary | ICD-10-CM

## 2019-01-14 DIAGNOSIS — N760 Acute vaginitis: Secondary | ICD-10-CM | POA: Insufficient documentation

## 2019-01-14 DIAGNOSIS — J45909 Unspecified asthma, uncomplicated: Secondary | ICD-10-CM | POA: Insufficient documentation

## 2019-01-14 LAB — URINALYSIS, ROUTINE W REFLEX MICROSCOPIC
Bilirubin Urine: NEGATIVE
Glucose, UA: NEGATIVE mg/dL
Hgb urine dipstick: NEGATIVE
Ketones, ur: NEGATIVE mg/dL
Leukocytes,Ua: NEGATIVE
Nitrite: NEGATIVE
Protein, ur: NEGATIVE mg/dL
Specific Gravity, Urine: 1.015 (ref 1.005–1.030)
pH: 7 (ref 5.0–8.0)

## 2019-01-14 LAB — WET PREP, GENITAL
Sperm: NONE SEEN
Trich, Wet Prep: NONE SEEN
Yeast Wet Prep HPF POC: NONE SEEN

## 2019-01-14 LAB — PREGNANCY, URINE: Preg Test, Ur: NEGATIVE

## 2019-01-14 MED ORDER — METRONIDAZOLE 500 MG PO TABS: 500.0000 mg | ORAL_TABLET | Freq: Two times a day (BID) | ORAL | 0 refills | Status: AC

## 2019-01-14 NOTE — ED Triage Notes (Signed)
States ibuprofen 800mg  last yesterday pm.

## 2019-01-14 NOTE — ED Triage Notes (Signed)
Patient complains of lower abd pain onset yesterday; denies NVD; denies fever.

## 2019-01-14 NOTE — ED Notes (Signed)
Patient instructed to return at 1500 today for outpatient [place]; patient verbalized understanding.

## 2019-01-14 NOTE — ED Provider Notes (Signed)
[facility] EMERGENCY DEPARTMENT Provider Note   CSN: [id number] Arrival date & time: 01/14/19  0253    History   Chief Complaint Chief Complaint  Patient presents with  . Abdominal Pain    HPI Jamie Gonzales is a 28 y.o. female with a PMHx of asthma who presents to the ED with c/o lower abdominal pain.   Ms. Jamie Gonzales states the pain began yesterday morning and has since progressively worsened. It is located in there lower abdomen, mainly in the center but also on the RLQ and LLQ. Does not radiate anywhere else. She describes the pain as intermittent, cramping and 8/10. The pain comes on approximately every 20 minutes. She endorses mild urinary frequency yesterday, but denies fever, chills, nausea, vomiting, diarrhea, constipation, hematuria, dysuria, vaginal discharge, vaginal pain. She notes that she hasn't eaten anything unusual recently, but did have [place] yesterday before the symptoms.   Her last menstrual cycle began 6 days ago and ended 2 days ago. She denies known exposure to STDs. Uses protection all the time.   Reports she has been to the ED in the past for abdominal pain and at the time diagnosed with bacterial vaginosis and given one week of antibiotics. However, current symptoms are not similar, as she denies vaginal discharge.   The history is provided by the patient.  Abdominal Pain Associated symptoms: no chest pain, no chills, no constipation, no diarrhea, no dysuria, no fever, no hematuria, no nausea, no vaginal bleeding, no vaginal discharge and no vomiting     Past Medical History:  Diagnosis Date  . Asthma     There are no active problems to display for this patient.   History reviewed. No pertinent surgical history.   OB History    Gravida  2   Para      Term      Preterm      AB  1   Living        SAB  1   TAB      Ectopic      Multiple      Live Births               Home Medications    Prior to Admission  medications   Medication Sig Start Date End Date Taking? Authorizing Provider  albuterol (PROVENTIL HFA;VENTOLIN HFA) 108 (90 Base) MCG/ACT inhaler Inhale 2 puffs into the lungs every 6 (six) hours as needed for up to 30 days for wheezing or shortness of breath. 09/24/18 10/24/18  Ronnie DossMcLean, Kelly A, PA-C  metroNIDAZOLE (FLAGYL) 500 MG tablet Take 1 tablet (500 mg total) by mouth 2 (two) times daily for 7 days. 01/14/19 01/21/19  Verdene LennertBasaraba, Carron Jaggi, MD  ondansetron (ZOFRAN ODT) 4 MG disintegrating tablet Take 1 tablet (4 mg total) by mouth every 8 (eight) hours as needed for nausea or vomiting. 01/29/18   Street, ShivelyMercedes, PA-C  ondansetron (ZOFRAN ODT) 8 MG disintegrating tablet Take 1 tablet (8 mg total) by mouth every 8 (eight) hours as needed for nausea or vomiting. 01/21/18   Molpus, Jonny RuizJohn, MD    Family History Family History  Problem Relation Age of Onset  . Asthma Mother   . Asthma Maternal Grandmother     Social History Social History   Tobacco Use  . Smoking status: Current Every Day Smoker    Packs/day: 0.50    Types: Cigarettes  . Smokeless tobacco: Never Used  Substance Use Topics  . Alcohol use: Yes  Comment: social  . Drug use: Yes    Types: Marijuana    Comment: smells of marijuana     Allergies   Aspirin, Amoxicillin, Eggs or egg-derived products, and Benadryl [diphenhydramine hcl]   Review of Systems Review of Systems  Constitutional: Negative for chills and fever.  Cardiovascular: Negative for chest pain.  Gastrointestinal: Positive for abdominal pain. Negative for constipation, diarrhea, nausea and vomiting.  Genitourinary: Positive for frequency. Negative for difficulty urinating, dysuria, hematuria, menstrual problem, vaginal bleeding, vaginal discharge and vaginal pain.  Musculoskeletal: Negative for back pain.  Skin: Negative for rash and wound.  Neurological: Negative for headaches.  All other systems reviewed and are negative.    Physical Exam Updated  Vital Signs BP 118/76 (BP Location: Left Arm)   Pulse 67   Temp 98.5 F (36.9 C) (Oral)   Resp 18   Ht 5\' 3"  (1.6 m)   Wt 65.8 kg   LMP 01/01/2019   SpO2 99%   BMI 25.70 kg/m   Physical Exam Vitals signs and nursing note reviewed.  Constitutional:      General: She is not in acute distress.    Appearance: She is well-developed. She is not toxic-appearing.  HENT:     Head: Normocephalic and atraumatic.  Pulmonary:     Effort: Pulmonary effort is normal. No respiratory distress.  Abdominal:     General: Bowel sounds are normal. There is no distension. There are no signs of injury.     Palpations: Abdomen is soft.     Tenderness: There is abdominal tenderness in the right lower quadrant, suprapubic area and left lower quadrant. There is no guarding or rebound. Negative signs include McBurney's sign.  Genitourinary:    Vagina: Normal. No tenderness or bleeding.     Cervix: Discharge present. No friability.     Uterus: Normal. Not tender.      Adnexa:        Right: No tenderness.         Left: No tenderness.    Skin:    General: Skin is warm and dry.  Neurological:     General: No focal deficit present.     Mental Status: She is alert and oriented to person, place, and time.  Psychiatric:        Mood and Affect: Mood normal.        Behavior: Behavior normal.      ED Treatments / Results  Labs (all labs ordered are listed, but only abnormal results are displayed) Labs Reviewed  WET PREP, GENITAL - Abnormal; Notable for the following components:      Result Value   Clue Cells Wet Prep HPF POC PRESENT (*)    WBC, Wet Prep HPF POC MODERATE (*)    All other components within normal limits  URINALYSIS, ROUTINE W REFLEX MICROSCOPIC  PREGNANCY, URINE  GC/CHLAMYDIA PROBE AMP ([place]) NOT AT [facility]    EKG None  Radiology No results found.  Procedures Procedures (including critical care time)  Medications Ordered in ED Medications - No data to display    Initial Impression / Assessment and Plan / ED Course  I have reviewed the triage vital signs and the nursing notes.  Pertinent labs & imaging results that were available during my care of the patient were reviewed by me and considered in my medical decision making (see chart for details).  Ms. Jamie Gonzales is a 28 y/o female with a PMHx of asthma and bacterial vaginosis that presents to  the ED with c/o lower abdominal pain.   Patient's pain is not associated with any other symptoms, per history, other than slightly increased urinary frequency yesterday. Her physical examination was mostly benign, other than some mild tenderness to palpation. She notes that she has been here in the past with stomach pain but at that time, also had vaginal discharge. She denies that current symptoms are similar. She denies pregnancy or STD exposure.  Given suprapubic pain and urinary frequency, urinalysis was ordered and negative, ruling out UTI. Pregnancy was negative. Pelvic exam was performed and there was vaginal discharge. Wet prep showed moderate WBC with present Clue cells. G/C has been ordered and results pending.   At this time, patient's abdominal pain is likely secondary to a recurrence of bacterial vaginosis, given wet prep results. She is being discharged with 7 days of Flagyl 500mg  BID. Pelvic ultrasound has been ordered for patient to schedule to rule out additional causes of abdominal pain.   Ms. Diesing states she understands the plan and feels safe to be discharged at this time.   Final Clinical Impressions(s) / ED Diagnoses   Final diagnoses:  Bacterial vaginosis  Lower abdominal pain    ED Discharge Orders         Ordered    US PELVIC DOPPLER (TORSION R/O OR MASS ARTERIAL FLOW)     01/14/19 0638    metroNIDAZOLE (FLAGYL) 500 MG tablet  2 times daily     01/14/19 7867           Jose Persia, MD 01/14/19 6720    Merrily Pew, MD 01/15/19 [phone]

## 2019-01-17 LAB — GC/CHLAMYDIA PROBE AMP (~~LOC~~) NOT AT ARMC
Chlamydia: NEGATIVE
Neisseria Gonorrhea: NEGATIVE

## 2019-04-20 ENCOUNTER — Emergency Department (HOSPITAL_BASED_OUTPATIENT_CLINIC_OR_DEPARTMENT_OTHER)
Admission: EM | Admit: 2019-04-20 | Discharge: 2019-04-20 | Disposition: A | Discharge status: Home / Self Care | Payer: Medicaid Other | Attending: Emergency Medicine | Admitting: Emergency Medicine

## 2019-04-20 ENCOUNTER — Other Ambulatory Visit: Payer: Self-pay

## 2019-04-20 ENCOUNTER — Encounter (HOSPITAL_BASED_OUTPATIENT_CLINIC_OR_DEPARTMENT_OTHER): Payer: Self-pay | Admitting: Emergency Medicine

## 2019-04-20 DIAGNOSIS — F17210 Nicotine dependence, cigarettes, uncomplicated: Secondary | ICD-10-CM | POA: Insufficient documentation

## 2019-04-20 DIAGNOSIS — N899 Noninflammatory disorder of vagina, unspecified: Secondary | ICD-10-CM | POA: Diagnosis present

## 2019-04-20 DIAGNOSIS — J45909 Unspecified asthma, uncomplicated: Secondary | ICD-10-CM | POA: Insufficient documentation

## 2019-04-20 DIAGNOSIS — B9689 Other specified bacterial agents as the cause of diseases classified elsewhere: Secondary | ICD-10-CM

## 2019-04-20 DIAGNOSIS — N760 Acute vaginitis: Secondary | ICD-10-CM | POA: Diagnosis not present

## 2019-04-20 DIAGNOSIS — Z91012 Allergy to eggs: Secondary | ICD-10-CM | POA: Insufficient documentation

## 2019-04-20 DIAGNOSIS — Z888 Allergy status to other drugs, medicaments and biological substances status: Secondary | ICD-10-CM | POA: Insufficient documentation

## 2019-04-20 DIAGNOSIS — Z880 Allergy status to penicillin: Secondary | ICD-10-CM | POA: Diagnosis not present

## 2019-04-20 DIAGNOSIS — B373 Candidiasis of vulva and vagina: Secondary | ICD-10-CM

## 2019-04-20 DIAGNOSIS — Z886 Allergy status to analgesic agent status: Secondary | ICD-10-CM | POA: Insufficient documentation

## 2019-04-20 DIAGNOSIS — B3731 Acute candidiasis of vulva and vagina: Secondary | ICD-10-CM

## 2019-04-20 LAB — URINALYSIS, MICROSCOPIC (REFLEX)

## 2019-04-20 LAB — URINALYSIS, ROUTINE W REFLEX MICROSCOPIC
Bilirubin Urine: NEGATIVE
Glucose, UA: NEGATIVE mg/dL
Hgb urine dipstick: NEGATIVE
Ketones, ur: NEGATIVE mg/dL
Nitrite: NEGATIVE
Protein, ur: NEGATIVE mg/dL
Specific Gravity, Urine: 1.02 (ref 1.005–1.030)
pH: 7.5 (ref 5.0–8.0)

## 2019-04-20 LAB — WET PREP, GENITAL
Sperm: NONE SEEN
Trich, Wet Prep: NONE SEEN

## 2019-04-20 LAB — PREGNANCY, URINE: Preg Test, Ur: NEGATIVE

## 2019-04-20 MED ORDER — METRONIDAZOLE 500 MG PO TABS: 500.0000 mg | ORAL_TABLET | Freq: Two times a day (BID) | ORAL | 0 refills | Status: DC

## 2019-04-20 MED ORDER — FLUCONAZOLE 150 MG PO TABS
150.0000 mg | ORAL_TABLET | Freq: Once | ORAL | Status: AC
  Administered 2019-04-20: 150 mg via ORAL
  Filled 2019-04-20: qty 1

## 2019-04-20 NOTE — ED Provider Notes (Signed)
[facility] Provider Note: Georgena Spurling, MD, FACEP  CSN: [id number] MRN: [id number] ARRIVAL: 04/20/19 at [place]: [place]  Vaginal Discharge   HISTORY OF PRESENT ILLNESS  04/20/19 2:36 AM Jamie Gonzales is a 28 y.o. female with a history of bacterial vaginosis.  She is here with a 1 day history of white vaginal discharge with abnormal odor.  Symptoms are consistent with previous bacterial vaginosis.  She has some mild associated suprapubic discomfort not worse with movement or palpation.  She denies vaginal bleeding.  Her LMP was 03/27/2019.   Past Medical History:  Diagnosis Date  . Asthma     History reviewed. No pertinent surgical history.  Family History  Problem Relation Age of Onset  . Asthma Mother   . Asthma Maternal Grandmother     Social History   Tobacco Use  . Smoking status: Current Every Day Smoker    Packs/day: 0.50    Types: Cigarettes  . Smokeless tobacco: Never Used  Substance Use Topics  . Alcohol use: Yes    Comment: social  . Drug use: Yes    Types: Marijuana    Comment: smells of marijuana    Prior to Admission medications   Medication Sig Start Date End Date Taking? Authorizing Provider  albuterol (PROVENTIL HFA;VENTOLIN HFA) 108 (90 Base) MCG/ACT inhaler Inhale 2 puffs into the lungs every 6 (six) hours as needed for up to 30 days for wheezing or shortness of breath. 09/24/18 10/24/18  Madilyn Hook A, PA-C    Allergies Aspirin, Amoxicillin, Eggs or egg-derived products, and Benadryl [diphenhydramine hcl]   REVIEW OF SYSTEMS  Negative except as noted here or in the History of Present Illness.   PHYSICAL EXAMINATION  Initial Vital Signs Blood pressure 116/81, pulse (!) 50, temperature 98.6 F (37 C), temperature source Oral, resp. rate 16, height 5\' 3"  (1.6 m), weight 63.5 kg, last menstrual period 03/27/2019, SpO2 100 %.  Examination General: Well-developed, well-nourished female in no acute  distress; appearance consistent with age of record HENT: normocephalic; atraumatic Eyes: pupils equal, round and reactive to light; extraocular muscles intact Neck: supple Heart: regular rate and rhythm Lungs: clear to auscultation bilaterally Abdomen: soft; nondistended; nontender; no masses or hepatosplenomegaly; bowel sounds present GU: Normal external genitalia; white vaginal discharge; no vaginal bleeding; no cervical motion tenderness; no adnexal tenderness Extremities: No deformity; full range of motion; pulses normal Neurologic: Awake, alert and oriented; motor function intact in all extremities and symmetric; no facial droop Skin: Warm and dry Psychiatric: Normal mood and affect   RESULTS  Summary of this visit's results, reviewed by myself:   EKG Interpretation  Date/Time:    Ventricular Rate:    PR Interval:    QRS Duration:   QT Interval:    QTC Calculation:   R Axis:     Text Interpretation:        Laboratory Studies: Results for orders placed or performed during the hospital encounter of 04/20/19 (from the past 24 hour(s))  Urinalysis, Routine w reflex microscopic     Status: Abnormal   Collection Time: 04/20/19  1:57 AM  Result Value Ref Range   Color, Urine YELLOW YELLOW   APPearance CLOUDY (A) CLEAR   Specific Gravity, Urine 1.020 1.005 - 1.030   pH 7.5 5.0 - 8.0   Glucose, UA NEGATIVE NEGATIVE mg/dL   Hgb urine dipstick NEGATIVE NEGATIVE   Bilirubin Urine NEGATIVE NEGATIVE   Ketones, ur NEGATIVE NEGATIVE mg/dL  Protein, ur NEGATIVE NEGATIVE mg/dL   Nitrite NEGATIVE NEGATIVE   Leukocytes,Ua SMALL (A) NEGATIVE  Pregnancy, urine     Status: None   Collection Time: 04/20/19  1:57 AM  Result Value Ref Range   Preg Test, Ur NEGATIVE NEGATIVE  Urinalysis, Microscopic (reflex)     Status: Abnormal   Collection Time: 04/20/19  1:57 AM  Result Value Ref Range   RBC / HPF 0-5 0 - 5 RBC/hpf   WBC, UA 6-10 0 - 5 WBC/hpf   Bacteria, UA FEW (A) NONE SEEN    Squamous Epithelial / LPF 0-5 0 - 5   Mucus PRESENT    Amorphous Crystal PRESENT    Triple Phosphate Crystal PRESENT   Wet prep, genital     Status: Abnormal   Collection Time: 04/20/19  2:50 AM   Specimen: Vaginal  Result Value Ref Range   Yeast Wet Prep HPF POC PRESENT (A) NONE SEEN   Trich, Wet Prep NONE SEEN NONE SEEN   Clue Cells Wet Prep HPF POC PRESENT (A) NONE SEEN   WBC, Wet Prep HPF POC MANY (A) NONE SEEN   Sperm NONE SEEN    Imaging Studies: No results found.  ED COURSE and MDM  Nursing notes and initial vitals signs, including pulse oximetry, reviewed.  Vitals:   04/20/19 0152 04/20/19 0153 04/20/19 0155  BP:   116/81  Pulse:   (!) 50  Resp:   16  Temp:  98.6 F (37 C)   TempSrc:  Oral   SpO2:   100%  Weight: 63.5 kg    Height: 5\' 3"  (1.6 m)     Will treat for BV and vulvovaginal candidiasis.  PROCEDURES    ED DIAGNOSES     ICD-10-CM   1. Bacterial vaginosis  N76.0    B96.89   2. Vaginal yeast infection  B37.3        Jolea Dolle, MD 04/20/19 [phone]

## 2019-04-20 NOTE — ED Triage Notes (Signed)
Pt c/o lower abd pain with vaginal discharge.

## 2019-04-21 LAB — GC/CHLAMYDIA PROBE AMP (~~LOC~~) NOT AT ARMC
Chlamydia: NEGATIVE
Neisseria Gonorrhea: NEGATIVE

## 2019-06-29 ENCOUNTER — Other Ambulatory Visit: Payer: Self-pay

## 2019-06-29 ENCOUNTER — Encounter (HOSPITAL_BASED_OUTPATIENT_CLINIC_OR_DEPARTMENT_OTHER): Payer: Self-pay

## 2019-06-29 ENCOUNTER — Emergency Department (HOSPITAL_BASED_OUTPATIENT_CLINIC_OR_DEPARTMENT_OTHER)
Admission: EM | Admit: 2019-06-29 | Discharge: 2019-06-30 | Disposition: A | Discharge status: Home / Self Care | Payer: Medicaid Other | Attending: Emergency Medicine | Admitting: Emergency Medicine

## 2019-06-29 DIAGNOSIS — J45909 Unspecified asthma, uncomplicated: Secondary | ICD-10-CM | POA: Diagnosis not present

## 2019-06-29 DIAGNOSIS — F17210 Nicotine dependence, cigarettes, uncomplicated: Secondary | ICD-10-CM | POA: Insufficient documentation

## 2019-06-29 DIAGNOSIS — N760 Acute vaginitis: Secondary | ICD-10-CM | POA: Insufficient documentation

## 2019-06-29 DIAGNOSIS — N898 Other specified noninflammatory disorders of vagina: Secondary | ICD-10-CM | POA: Diagnosis present

## 2019-06-29 DIAGNOSIS — B9689 Other specified bacterial agents as the cause of diseases classified elsewhere: Secondary | ICD-10-CM | POA: Diagnosis not present

## 2019-06-29 NOTE — ED Triage Notes (Signed)
Vaginal discharge x 2 days, white thin and malodorous. Has hx of shame. NAD.

## 2019-06-30 LAB — URINALYSIS, ROUTINE W REFLEX MICROSCOPIC
Bilirubin Urine: NEGATIVE
Glucose, UA: NEGATIVE mg/dL
Hgb urine dipstick: NEGATIVE
Ketones, ur: NEGATIVE mg/dL
Leukocytes,Ua: NEGATIVE
Nitrite: POSITIVE — AB
Protein, ur: NEGATIVE mg/dL
Specific Gravity, Urine: 1.015 (ref 1.005–1.030)
pH: 7 (ref 5.0–8.0)

## 2019-06-30 LAB — PREGNANCY, URINE: Preg Test, Ur: NEGATIVE

## 2019-06-30 LAB — URINALYSIS, MICROSCOPIC (REFLEX)

## 2019-06-30 MED ORDER — METRONIDAZOLE 500 MG PO TABS: 500.0000 mg | ORAL_TABLET | Freq: Two times a day (BID) | ORAL | 1 refills | Status: DC

## 2019-06-30 MED ORDER — METRONIDAZOLE 500 MG PO TABS
500.0000 mg | ORAL_TABLET | Freq: Once | ORAL | Status: AC
  Administered 2019-06-30: 500 mg via ORAL
  Filled 2019-06-30: qty 1

## 2019-06-30 NOTE — Discharge Instructions (Addendum)
Flagyl as prescribed.  Return to the emergency department or see your primary doctor if symptoms or not improving in the next few days, or you develop severe abdominal pain, high fever, or other new and concerning symptoms.

## 2019-06-30 NOTE — ED Provider Notes (Signed)
[facility] EMERGENCY DEPARTMENT Provider Note   CSN: [id number] Arrival date & time: 06/29/19  2338     History Chief Complaint  Patient presents with  . Vaginal Discharge    Jamie Gonzales is a 28 y.o. female.  Patient is a 28 year old female with history of asthma.  She presents with complaints of vaginal discharge.  Patient states that she has had issues with this for quite some time.  She tells me she gets bacterial vaginosis about every 2 months.  She denies any fevers or chills.  She admits to sex with one partner with whom she has had a monogamous relationship for the past 4 years.  She denies the possibility of STD.  The history is provided by the patient.  Vaginal Discharge Quality:  Malodorous and white Severity:  Moderate Onset quality:  Gradual Duration:  2 days Timing:  Constant Progression:  Worsening Chronicity:  Recurrent Relieved by:  Nothing Worsened by:  Nothing      Past Medical History:  Diagnosis Date  . Asthma     There are no problems to display for this patient.   History reviewed. No pertinent surgical history.   OB History    Gravida  2   Para      Term      Preterm      AB  1   Living        SAB  1   TAB      Ectopic      Multiple      Live Births              Family History  Problem Relation Age of Onset  . Asthma Mother   . Asthma Maternal Grandmother     Social History   Tobacco Use  . Smoking status: Current Every Day Smoker    Packs/day: 0.50    Types: Cigarettes  . Smokeless tobacco: Never Used  Substance Use Topics  . Alcohol use: Yes    Comment: social  . Drug use: Yes    Types: Marijuana    Comment: smells of marijuana    Home Medications Prior to Admission medications   Medication Sig Start Date End Date Taking? Authorizing Provider  albuterol (PROVENTIL HFA;VENTOLIN HFA) 108 (90 Base) MCG/ACT inhaler Inhale 2 puffs into the lungs every 6 (six) hours as needed for up to  30 days for wheezing or shortness of breath. 09/24/18 10/24/18  Ronnie Doss A, PA-C  metroNIDAZOLE (FLAGYL) 500 MG tablet Take 1 tablet (500 mg total) by mouth 2 (two) times daily. One po bid x 7 days 04/20/19   Molpus, John, MD    Allergies    Aspirin, Amoxicillin, Eggs or egg-derived products, and Benadryl [diphenhydramine hcl]  Review of Systems   Review of Systems  Genitourinary: Positive for vaginal discharge.  All other systems reviewed and are negative.   Physical Exam Updated Vital Signs BP 129/78 (BP Location: Right Arm)   Pulse (!) 59   Temp 98.4 F (36.9 C)   Resp 20   Ht 5\' 3"  (1.6 m)   Wt 65.8 kg   SpO2 100%   BMI 25.69 kg/m   Physical Exam Vitals and nursing note reviewed.  Constitutional:      General: She is not in acute distress.    Appearance: Normal appearance. She is not ill-appearing.  HENT:     Head: Normocephalic and atraumatic.  Pulmonary:     Effort: Pulmonary effort is  normal.  Skin:    General: Skin is warm and dry.  Neurological:     Mental Status: She is alert.     ED Results / Procedures / Treatments   Labs (all labs ordered are listed, but only abnormal results are displayed) Labs Reviewed  URINALYSIS, ROUTINE W REFLEX MICROSCOPIC - Abnormal; Notable for the following components:      Result Value   APPearance CLOUDY (*)    Nitrite POSITIVE (*)    All other components within normal limits  URINALYSIS, MICROSCOPIC (REFLEX) - Abnormal; Notable for the following components:   Bacteria, UA MANY (*)    All other components within normal limits  PREGNANCY, URINE    EKG None  Radiology No results found.  Procedures Procedures (including critical care time)  Medications Ordered in ED Medications - No data to display  ED Course  I have reviewed the triage vital signs and the nursing notes.  Pertinent labs & imaging results that were available during my care of the patient were reviewed by me and considered in my medical  decision making (see chart for details).    MDM Rules/Calculators/A&P  Patient presenting with recurrent vaginal discharge.  She has a history of recurrent bacterial vaginosis and this feels the same.  Patient is declining a pelvic exam and is requesting to be treated with Flagyl.  Patient advised that I cannot completely rule out STD or other problem without an exam, but prefers to go this route.  She will be prescribed Flagyl.  She is to return as needed if her symptoms worsen.  Final Clinical Impression(s) / ED Diagnoses Final diagnoses:  None    Rx / DC Orders ED Discharge Orders    None       Veryl Speak, MD 06/30/19 [phone]

## 2020-04-29 ENCOUNTER — Emergency Department (HOSPITAL_BASED_OUTPATIENT_CLINIC_OR_DEPARTMENT_OTHER)
Admission: EM | Admit: 2020-04-29 | Discharge: 2020-04-29 | Disposition: A | Discharge status: Home / Self Care | Payer: Medicaid Other | Attending: Emergency Medicine | Admitting: Emergency Medicine

## 2020-04-29 ENCOUNTER — Other Ambulatory Visit: Payer: Self-pay

## 2020-04-29 ENCOUNTER — Other Ambulatory Visit (HOSPITAL_BASED_OUTPATIENT_CLINIC_OR_DEPARTMENT_OTHER): Payer: Self-pay | Admitting: Emergency Medicine

## 2020-04-29 ENCOUNTER — Encounter (HOSPITAL_BASED_OUTPATIENT_CLINIC_OR_DEPARTMENT_OTHER): Payer: Self-pay

## 2020-04-29 DIAGNOSIS — H538 Other visual disturbances: Secondary | ICD-10-CM | POA: Insufficient documentation

## 2020-04-29 DIAGNOSIS — N760 Acute vaginitis: Secondary | ICD-10-CM

## 2020-04-29 DIAGNOSIS — F17210 Nicotine dependence, cigarettes, uncomplicated: Secondary | ICD-10-CM | POA: Insufficient documentation

## 2020-04-29 DIAGNOSIS — W268XXA Contact with other sharp object(s), not elsewhere classified, initial encounter: Secondary | ICD-10-CM | POA: Insufficient documentation

## 2020-04-29 DIAGNOSIS — H53142 Visual discomfort, left eye: Secondary | ICD-10-CM | POA: Diagnosis not present

## 2020-04-29 DIAGNOSIS — H5712 Ocular pain, left eye: Secondary | ICD-10-CM | POA: Diagnosis present

## 2020-04-29 DIAGNOSIS — S0502XA Injury of conjunctiva and corneal abrasion without foreign body, left eye, initial encounter: Secondary | ICD-10-CM | POA: Insufficient documentation

## 2020-04-29 DIAGNOSIS — B9689 Other specified bacterial agents as the cause of diseases classified elsewhere: Secondary | ICD-10-CM

## 2020-04-29 DIAGNOSIS — J45909 Unspecified asthma, uncomplicated: Secondary | ICD-10-CM | POA: Diagnosis not present

## 2020-04-29 DIAGNOSIS — N898 Other specified noninflammatory disorders of vagina: Secondary | ICD-10-CM | POA: Diagnosis not present

## 2020-04-29 LAB — WET PREP, GENITAL
Sperm: NONE SEEN
Trich, Wet Prep: NONE SEEN
Yeast Wet Prep HPF POC: NONE SEEN

## 2020-04-29 LAB — URINALYSIS, ROUTINE W REFLEX MICROSCOPIC
Bilirubin Urine: NEGATIVE
Glucose, UA: NEGATIVE mg/dL
Ketones, ur: 15 mg/dL — AB
Nitrite: POSITIVE — AB
Protein, ur: 30 mg/dL — AB
Specific Gravity, Urine: 1.025 (ref 1.005–1.030)
pH: 6.5 (ref 5.0–8.0)

## 2020-04-29 LAB — URINALYSIS, MICROSCOPIC (REFLEX)

## 2020-04-29 LAB — PREGNANCY, URINE: Preg Test, Ur: NEGATIVE

## 2020-04-29 MED ORDER — NITROFURANTOIN MONOHYD MACRO 100 MG PO CAPS: 100.0000 mg | ORAL_CAPSULE | Freq: Two times a day (BID) | ORAL | 0 refills | Status: AC

## 2020-04-29 MED ORDER — TETRACAINE HCL 0.5 % OP SOLN
2.0000 [drp] | Freq: Once | OPHTHALMIC | Status: AC
  Administered 2020-04-29: 2 [drp] via OPHTHALMIC
  Filled 2020-04-29: qty 4

## 2020-04-29 MED ORDER — METRONIDAZOLE 500 MG PO TABS: 500.0000 mg | ORAL_TABLET | Freq: Two times a day (BID) | ORAL | 1 refills | Status: AC

## 2020-04-29 MED ORDER — ERYTHROMYCIN 5 MG/GM OP OINT: TOPICAL_OINTMENT | OPHTHALMIC | 0 refills | Status: AC

## 2020-04-29 MED ORDER — FLUORESCEIN SODIUM 1 MG OP STRP
1.0000 | ORAL_STRIP | Freq: Once | OPHTHALMIC | Status: AC
  Administered 2020-04-29: 1 via OPHTHALMIC
  Filled 2020-04-29: qty 1

## 2020-04-29 MED ORDER — METRONIDAZOLE 500 MG PO TABS: 500.0000 mg | ORAL_TABLET | Freq: Two times a day (BID) | ORAL | 1 refills | Status: DC

## 2020-04-29 NOTE — ED Provider Notes (Signed)
[facility] EMERGENCY DEPARTMENT Provider Note   CSN: [id number] Arrival date & time: 04/29/20  4742     History Chief Complaint  Patient presents with  . Eye Pain  . Vaginal Discharge    ELENOR WILDES is a 29 y.o. female.  HPI  patient is a 29 year old female with no pertinent past medical history presented today for 2 days of left eye pain, redness, blurry vision and photophobia.  She states that the symptoms began after she was using a tweezer to pluck her eyelashes when she poked her left eyeball.  She states that she immediately had some tearing and discomfort and states that after she blinked a lot she felt somewhat better.  She states that over the next couple days however it is been uncomfortable and has not been seeming to improve.  She has not use any medicines prior to arrival.  She states it is achy, constant, worse with light.  No other aggravating or mitigating factors.  Patient states she also has noted some vaginal discharge which she states has been scant.  She denies any abdominal or vaginal pain irritation any dysuria, frequency urgency, no flank pain, her menses have been normal.  She states that she has had unprotected sex with a new boyfriend recently.  She has plan to follow-up with her primary care doctor for the symptoms however since she was in the emergency department today brought them up.      Past Medical History:  Diagnosis Date  . Asthma     There are no problems to display for this patient.   History reviewed. No pertinent surgical history.   OB History    Gravida  2   Para      Term      Preterm      AB  1   Living        SAB  1   TAB      Ectopic      Multiple      Live Births              Family History  Problem Relation Age of Onset  . Asthma Mother   . Asthma Maternal Grandmother     Social History   Tobacco Use  . Smoking status: Current Every Day Smoker    Packs/day: 0.50    Types:  Cigarettes  . Smokeless tobacco: Never Used  Vaping Use  . Vaping Use: Never used  Substance Use Topics  . Alcohol use: Yes    Comment: social  . Drug use: Yes    Types: Marijuana    Comment: smells of marijuana    Home Medications Prior to Admission medications   Medication Sig Start Date End Date Taking? Authorizing Provider  albuterol (PROVENTIL HFA;VENTOLIN HFA) 108 (90 Base) MCG/ACT inhaler Inhale 2 puffs into the lungs every 6 (six) hours as needed for up to 30 days for wheezing or shortness of breath. 09/24/18 10/24/18  Ronnie Doss A, PA-C  metroNIDAZOLE (FLAGYL) 500 MG tablet Take 1 tablet (500 mg total) by mouth 2 (two) times daily. One po bid x 7 days 06/30/19   Geoffery Lyons, MD    Allergies    Aspirin, Amoxicillin, Eggs or egg-derived products, and Benadryl [diphenhydramine hcl]  Review of Systems   Review of Systems  Constitutional: Negative for fever.  HENT: Negative for congestion.   Eyes: Positive for photophobia, pain, redness and visual disturbance.  Respiratory: Negative for shortness of breath.  Cardiovascular: Negative for chest pain.  Gastrointestinal: Negative for abdominal distention.  Genitourinary: Positive for vaginal discharge. Negative for dyspareunia, pelvic pain, vaginal bleeding and vaginal pain.  Neurological: Negative for dizziness and headaches.    Physical Exam Updated Vital Signs BP (!) 137/93 (BP Location: Right Arm)   Pulse 100   Temp 98.5 F (36.9 C) (Oral)   Resp 18   Ht 5\' 3"  (1.6 m)   Wt 61.2 kg   LMP 04/16/2020   SpO2 99%   BMI 23.91 kg/m   Physical Exam Vitals and nursing note reviewed.  Constitutional:      General: She is not in acute distress.    Appearance: Normal appearance. She is not ill-appearing.     Comments: Pleasant well-appearing 29 year old.  In no acute distress.  Sitting comfortably in bed.  Able answer questions appropriately follow commands. No increased work of breathing. Speaking in full  sentences.   HENT:     Head: Normocephalic and atraumatic.     Mouth/Throat:     Mouth: Mucous membranes are moist.  Eyes:     General: No scleral icterus.       Right eye: No discharge.        Left eye: No discharge.     Conjunctiva/sclera: Conjunctivae normal.     Comments: No significant discharge of left eye.  No significant swelling.  Some conjunctival erythema and corneal abrasion evident with fluorescein uptake on fluorescein exam with [place] lamp.  Pulmonary:     Effort: Pulmonary effort is normal.     Breath sounds: No stridor.  Abdominal:     General: Abdomen is flat.     Tenderness: There is no abdominal tenderness. There is no right CVA tenderness, left CVA tenderness, guarding or rebound.  Genitourinary:    Comments: Deferred-would prefer to self swab Neurological:     Mental Status: She is alert and oriented to person, place, and time. Mental status is at baseline.     ED Results / Procedures / Treatments   Labs (all labs ordered are listed, but only abnormal results are displayed) Labs Reviewed  WET PREP, GENITAL  URINALYSIS, ROUTINE W REFLEX MICROSCOPIC  PREGNANCY, URINE  GC/CHLAMYDIA PROBE AMP ([place]) NOT AT [facility]    EKG None  Radiology No results found.  Procedures Procedures (including critical care time)  Medications Ordered in ED Medications  fluorescein ophthalmic strip 1 strip (has no administration in time range)  tetracaine (PONTOCAINE) 0.5 % ophthalmic solution 2 drop (has no administration in time range)    ED Course  I have reviewed the triage vital signs and the nursing notes.  Pertinent labs & imaging results that were available during my care of the patient were reviewed by me and considered in my medical decision making (see chart for details).    MDM Rules/Calculators/A&P                          Patient is a 29 year old female with past medical history detailed above presented today for 2 days of left eye pain with  plenty of tearing no purulent discharge some blurry vision and photophobia.  She states that this happened after mild eye trauma with tweezers.  Physical exam is notable for corneal abrasion.  She is not a contact lens wearer.  She does not have diabetes or any other immunosuppressive disease. Patient has soft globes.  No evidence of rupture.  Pain was completely abated with tetracaine.  Physical exam consistent with isolated corneal abrasion.  She also brought up that she has been having some vaginal discharge she is concerned for BV.  She deferred pelvic exam and is agreeable to self swab.  I did recommend that she follow-up with either the health department [facility] or her primary care doctor for further STD testing and evaluation and monitoring.  After shared decision-making irritation agreed to hold off on empiric treatment at this time.  Her wet prep was consistent with BV given the clue cells.  She did have a indeterminate urinalysis with plenty of squamous epithelial cells evidence of contamination.  She is not having any urinary symptoms.  We will hold off on empiric treatment.  Patient given printed prescription in case she begins having symptoms later.  She will follow up with her primary care doctor for this issue as well.  She will follow up with ophthalmology in 48 hours for her corneal abrasion given erythromycin ointment.  Final Clinical Impression(s) / ED Diagnoses Final diagnoses:  Abrasion of left cornea, initial encounter  Vaginal discharge    Rx / DC Orders ED Discharge Orders    None       Gailen Shelter, [place] 04/29/20 1118    Milagros Loll, MD 05/01/20 2339

## 2020-04-29 NOTE — ED Triage Notes (Signed)
Pt reports yesterday she was using tweezers on her eyelashes when she "pinched" her eye ball with the tweezers. Pt reports redness, drainage, pain and blurred vision.

## 2020-04-29 NOTE — Discharge Instructions (Addendum)
You have a corneal abrasion of your left eye.  Please use erythromycin ointment 4 times daily as prescribed.  Please follow-up with Dr. Wynell Balloon of ophthalmology.  Is addressed as below.  [address], [place], [place] [address] [phone]   As we discussed please follow-up with your primary care doctor for further STD testing.  Your testing today was positive for evidence of bacterial vaginosis.  This will be treated with Flagyl for 1 week.  Please take as prescribed for entire course even if your symptoms improve.   As we discussed I have also printed a prescription for an antibiotic in case you begin having symptoms consistent with a UTI (burning w urination, frequent urination, feeling like you must urinate all the time) or you may follow upwith your primary doctor for reevaluation of this.

## 2020-04-30 LAB — GC/CHLAMYDIA PROBE AMP (~~LOC~~) NOT AT ARMC
Chlamydia: NEGATIVE
Comment: NEGATIVE
Comment: NORMAL
Neisseria Gonorrhea: NEGATIVE

## 2021-02-24 ENCOUNTER — Encounter (HOSPITAL_BASED_OUTPATIENT_CLINIC_OR_DEPARTMENT_OTHER): Payer: Self-pay

## 2021-02-24 ENCOUNTER — Other Ambulatory Visit: Payer: Self-pay

## 2021-02-24 ENCOUNTER — Emergency Department (HOSPITAL_COMMUNITY): Payer: Medicaid Other

## 2021-02-24 ENCOUNTER — Emergency Department (HOSPITAL_COMMUNITY)
Admission: EM | Admit: 2021-02-24 | Discharge: 2021-02-24 | Disposition: A | Discharge status: Home / Self Care | Payer: Medicaid Other | Attending: Emergency Medicine | Admitting: Emergency Medicine

## 2021-02-24 ENCOUNTER — Encounter (HOSPITAL_COMMUNITY): Payer: Self-pay | Admitting: Emergency Medicine

## 2021-02-24 DIAGNOSIS — Z20822 Contact with and (suspected) exposure to covid-19: Secondary | ICD-10-CM | POA: Insufficient documentation

## 2021-02-24 DIAGNOSIS — R791 Abnormal coagulation profile: Secondary | ICD-10-CM | POA: Diagnosis not present

## 2021-02-24 DIAGNOSIS — S01312A Laceration without foreign body of left ear, initial encounter: Secondary | ICD-10-CM | POA: Diagnosis not present

## 2021-02-24 DIAGNOSIS — O0289 Other abnormal products of conception: Secondary | ICD-10-CM | POA: Diagnosis not present

## 2021-02-24 DIAGNOSIS — S0121XA Laceration without foreign body of nose, initial encounter: Secondary | ICD-10-CM | POA: Diagnosis not present

## 2021-02-24 DIAGNOSIS — Y906 Blood alcohol level of 120-199 mg/100 ml: Secondary | ICD-10-CM | POA: Diagnosis not present

## 2021-02-24 DIAGNOSIS — S0990XA Unspecified injury of head, initial encounter: Secondary | ICD-10-CM | POA: Diagnosis present

## 2021-02-24 DIAGNOSIS — Z23 Encounter for immunization: Secondary | ICD-10-CM | POA: Insufficient documentation

## 2021-02-24 DIAGNOSIS — S0181XA Laceration without foreign body of other part of head, initial encounter: Secondary | ICD-10-CM | POA: Diagnosis not present

## 2021-02-24 DIAGNOSIS — S299XXA Unspecified injury of thorax, initial encounter: Secondary | ICD-10-CM | POA: Insufficient documentation

## 2021-02-24 DIAGNOSIS — S3991XA Unspecified injury of abdomen, initial encounter: Secondary | ICD-10-CM | POA: Insufficient documentation

## 2021-02-24 HISTORY — DX: Anemia, unspecified: D64.9

## 2021-02-24 LAB — I-STAT CHEM 8, ED
BUN: 6 mg/dL (ref 6–20)
Calcium, Ion: 1.07 mmol/L — ABNORMAL LOW (ref 1.15–1.40)
Chloride: 108 mmol/L (ref 98–111)
Creatinine, Ser: 1.3 mg/dL — ABNORMAL HIGH (ref 0.44–1.00)
Glucose, Bld: 81 mg/dL (ref 70–99)
HCT: 40 % (ref 36.0–46.0)
Hemoglobin: 13.6 g/dL (ref 12.0–15.0)
Potassium: 2.9 mmol/L — ABNORMAL LOW (ref 3.5–5.1)
Sodium: 143 mmol/L (ref 135–145)
TCO2: 15 mmol/L — ABNORMAL LOW (ref 22–32)

## 2021-02-24 LAB — URINALYSIS, ROUTINE W REFLEX MICROSCOPIC
Bilirubin Urine: NEGATIVE
Glucose, UA: NEGATIVE mg/dL
Ketones, ur: 20 mg/dL — AB
Leukocytes,Ua: NEGATIVE
Nitrite: POSITIVE — AB
Protein, ur: NEGATIVE mg/dL
Specific Gravity, Urine: 1.029 (ref 1.005–1.030)
pH: 6 (ref 5.0–8.0)

## 2021-02-24 LAB — CBC
HCT: 36.5 % (ref 36.0–46.0)
Hemoglobin: 11.9 g/dL — ABNORMAL LOW (ref 12.0–15.0)
MCH: 29.6 pg (ref 26.0–34.0)
MCHC: 32.6 g/dL (ref 30.0–36.0)
MCV: 90.8 fL (ref 80.0–100.0)
Platelets: 266 10*3/uL (ref 150–400)
RBC: 4.02 MIL/uL (ref 3.87–5.11)
RDW: 14.2 % (ref 11.5–15.5)
WBC: 8.6 10*3/uL (ref 4.0–10.5)
nRBC: 0 % (ref 0.0–0.2)

## 2021-02-24 LAB — I-STAT BETA HCG BLOOD, ED (MC, WL, AP ONLY): I-stat hCG, quantitative: <5 m[IU]/mL (ref <5)

## 2021-02-24 LAB — RESP PANEL BY RT-PCR (FLU A&B, COVID) ARPGX2
Influenza A by PCR: NEGATIVE
Influenza B by PCR: NEGATIVE
SARS Coronavirus 2 by RT PCR: NEGATIVE

## 2021-02-24 LAB — COMPREHENSIVE METABOLIC PANEL
ALT: 15 U/L (ref 0–44)
AST: 27 U/L (ref 15–41)
Albumin: 4.5 g/dL (ref 3.5–5.0)
Alkaline Phosphatase: 50 U/L (ref 38–126)
Anion gap: 20 — ABNORMAL HIGH (ref 5–15)
BUN: 7 mg/dL (ref 6–20)
CO2: 13 mmol/L — ABNORMAL LOW (ref 22–32)
Calcium: 9.5 mg/dL (ref 8.9–10.3)
Chloride: 108 mmol/L (ref 98–111)
Creatinine, Ser: 1.32 mg/dL — ABNORMAL HIGH (ref 0.44–1.00)
GFR, Estimated: 56 mL/min — ABNORMAL LOW (ref >60)
Glucose, Bld: 83 mg/dL (ref 70–99)
Potassium: 3 mmol/L — ABNORMAL LOW (ref 3.5–5.1)
Sodium: 141 mmol/L (ref 135–145)
Total Bilirubin: 0.7 mg/dL (ref 0.3–1.2)
Total Protein: 7.4 g/dL (ref 6.5–8.1)

## 2021-02-24 LAB — SAMPLE TO BLOOD BANK

## 2021-02-24 LAB — LACTIC ACID, PLASMA
Lactic Acid, Venous: 9.9 mmol/L (ref 0.5–1.9)
Lactic Acid, Venous: 4.6 mmol/L (ref 0.5–1.9)

## 2021-02-24 LAB — ETHANOL: Alcohol, Ethyl (B): 132 mg/dL — ABNORMAL HIGH (ref <10)

## 2021-02-24 LAB — PROTIME-INR
INR: 1 (ref 0.8–1.2)
Prothrombin Time: 13.1 seconds (ref 11.4–15.2)

## 2021-02-24 MED ORDER — SODIUM CHLORIDE 0.9 % IV BOLUS
1000.0000 mL | Freq: Once | INTRAVENOUS | Status: AC
  Administered 2021-02-24: 1000 mL via INTRAVENOUS

## 2021-02-24 MED ORDER — IOHEXOL 300 MG/ML  SOLN
100.0000 mL | Freq: Once | INTRAMUSCULAR | Status: AC | PRN
  Administered 2021-02-24: 100 mL via INTRAVENOUS

## 2021-02-24 MED ORDER — TETANUS-DIPHTH-ACELL PERTUSSIS 5-2.5-18.5 LF-MCG/0.5 IM SUSY
0.5000 mL | PREFILLED_SYRINGE | Freq: Once | INTRAMUSCULAR | Status: AC
  Administered 2021-02-24: 0.5 mL via INTRAMUSCULAR
  Filled 2021-02-24: qty 0.5

## 2021-02-24 MED ORDER — LIDOCAINE HCL (PF) 1 % IJ SOLN
30.0000 mL | Freq: Once | INTRAMUSCULAR | Status: AC
  Administered 2021-02-24: 30 mL
  Filled 2021-02-24: qty 30

## 2021-02-24 NOTE — ED Notes (Signed)
Patient transported to CT scan . 

## 2021-02-24 NOTE — Discharge Instructions (Addendum)
You were evaluated in the Emergency Department and after careful evaluation, we did not find any emergent condition requiring admission or further testing in the hospital.  Your exam/testing today was overall reassuring.  We repaired your lacerations here in the emergency department.  The stitches will need to be removed in 3 to 5 days by healthcare professional.  As discussed, we recommend mental and physical rest for the next 2 days, keeping an eye out for concussion symptoms.  Please return to the Emergency Department if you experience any worsening of your condition.  Thank you for allowing [place] to be a part of your care.

## 2021-02-24 NOTE — ED Notes (Signed)
Patient transported to CT 
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
## 2021-02-24 NOTE — ED Notes (Addendum)
EDP explained tests results/plan of care  to patient and family . Suture cart / Lidocaine set up at bedside .

## 2021-02-24 NOTE — ED Notes (Addendum)
EDP notified on patient's abnormal lab test result.
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
